# Patient Record
Sex: Female | Born: 1985
Health system: Southern US, Community
[De-identification: ages and names within clinical notes are randomized; demographics above are authoritative.]

## PROBLEM LIST (undated history)

## (undated) DIAGNOSIS — I10 Essential (primary) hypertension: Secondary | ICD-10-CM

## (undated) HISTORY — PX: TUBAL LIGATION: SHX77

## (undated) HISTORY — PX: CHOLECYSTECTOMY: SHX55

## (undated) HISTORY — DX: Essential (primary) hypertension: I10

---

## 2008-08-01 ENCOUNTER — Observation Stay: Payer: Self-pay

## 2008-08-09 ENCOUNTER — Inpatient Hospital Stay: Payer: Self-pay

## 2008-08-09 ENCOUNTER — Observation Stay: Payer: Self-pay

## 2009-02-25 ENCOUNTER — Ambulatory Visit: Payer: Self-pay | Admitting: Family Medicine

## 2009-03-30 ENCOUNTER — Encounter: Payer: Self-pay | Admitting: Obstetrics and Gynecology

## 2009-05-14 ENCOUNTER — Encounter: Payer: Self-pay | Admitting: Maternal and Fetal Medicine

## 2009-07-20 ENCOUNTER — Encounter: Payer: Self-pay | Admitting: Maternal & Fetal Medicine

## 2009-08-06 ENCOUNTER — Encounter: Payer: Self-pay | Admitting: Obstetrics & Gynecology

## 2009-08-06 ENCOUNTER — Observation Stay: Payer: Self-pay | Admitting: Obstetrics and Gynecology

## 2009-08-07 ENCOUNTER — Inpatient Hospital Stay: Payer: Self-pay | Admitting: Obstetrics and Gynecology

## 2010-08-15 IMAGING — US ULTRAOUND OB LIMITED - NRPT [facility]
1 series · 5 of 5 positions shown · non-contrast
Comparison: none

[Series 1: ultraound ob limited - [facility]pt [facility] · 5 of 5 slices shown]
[im 1/5]
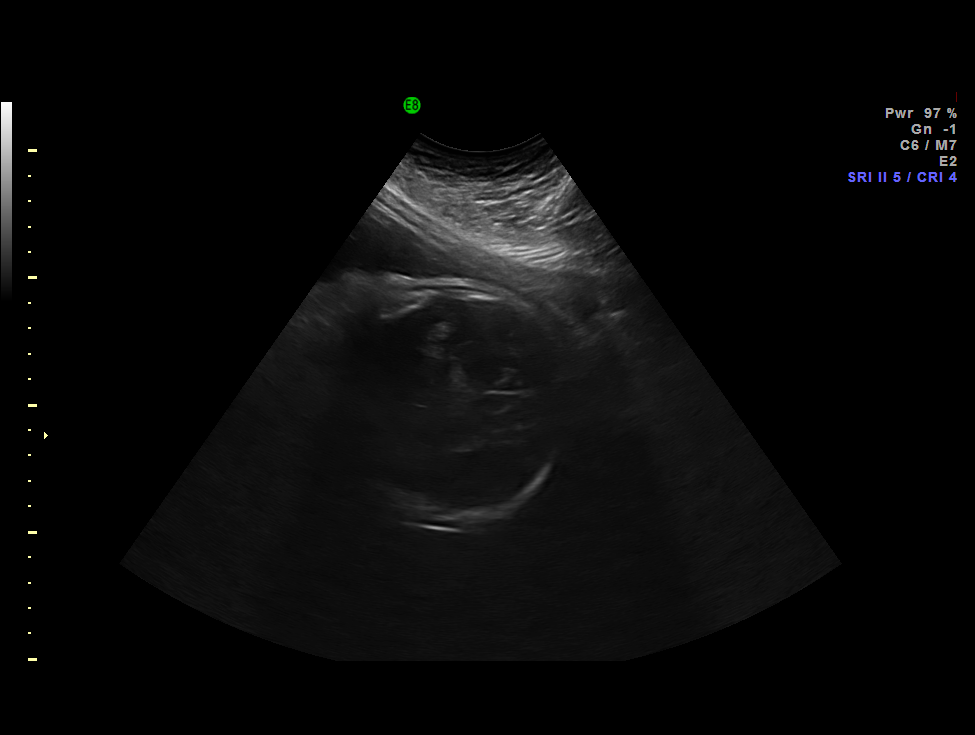
[im 2/5]
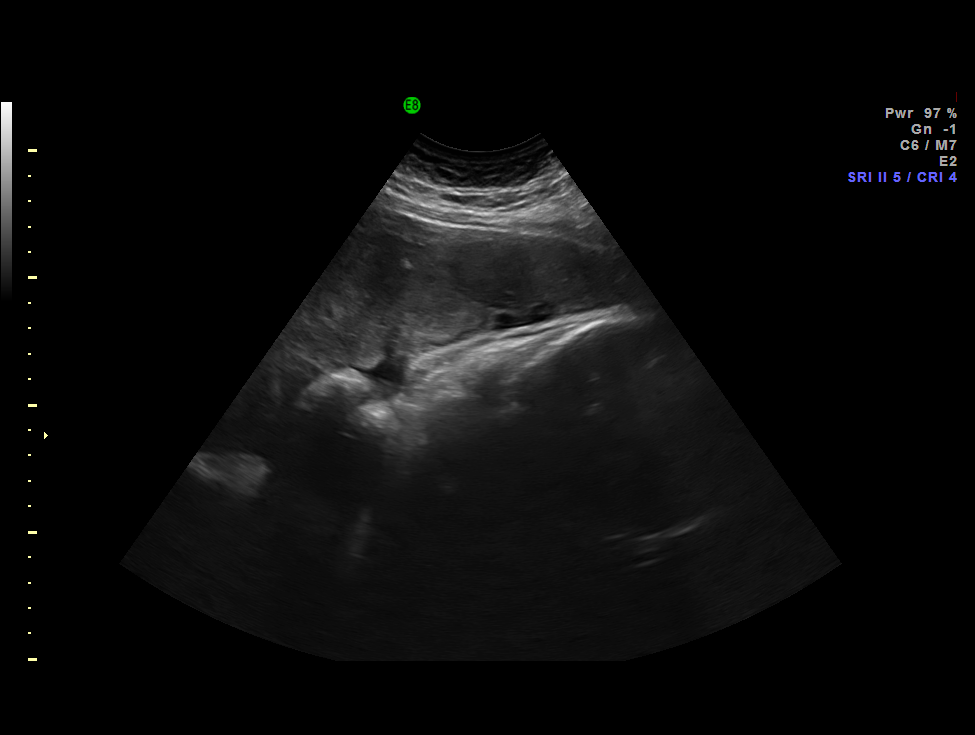
[im 3/5]
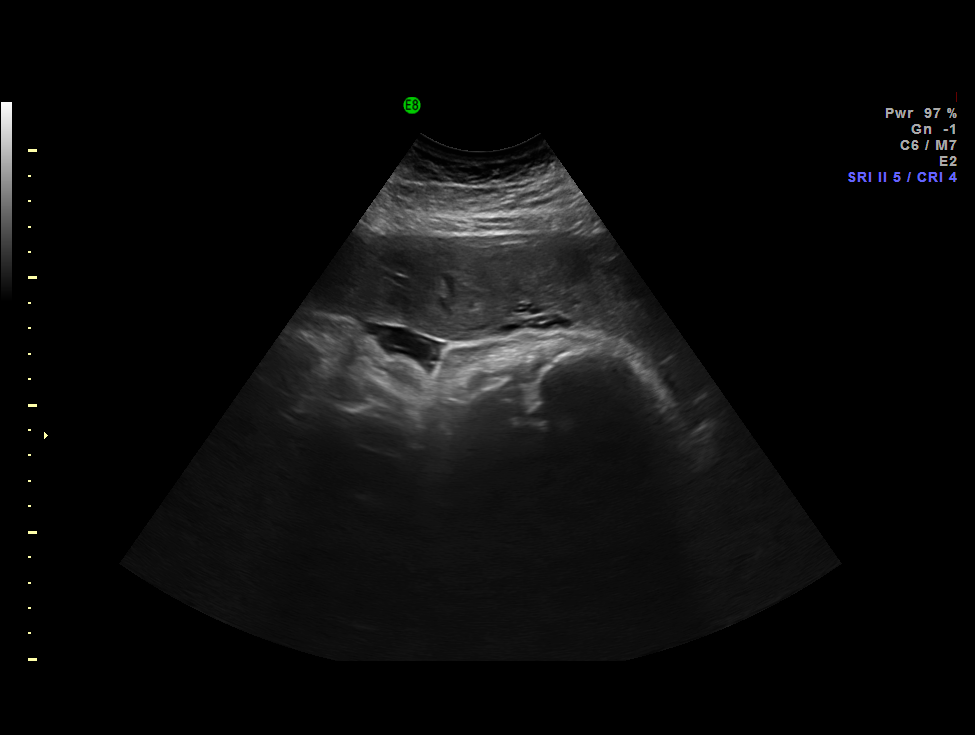
[im 4/5]
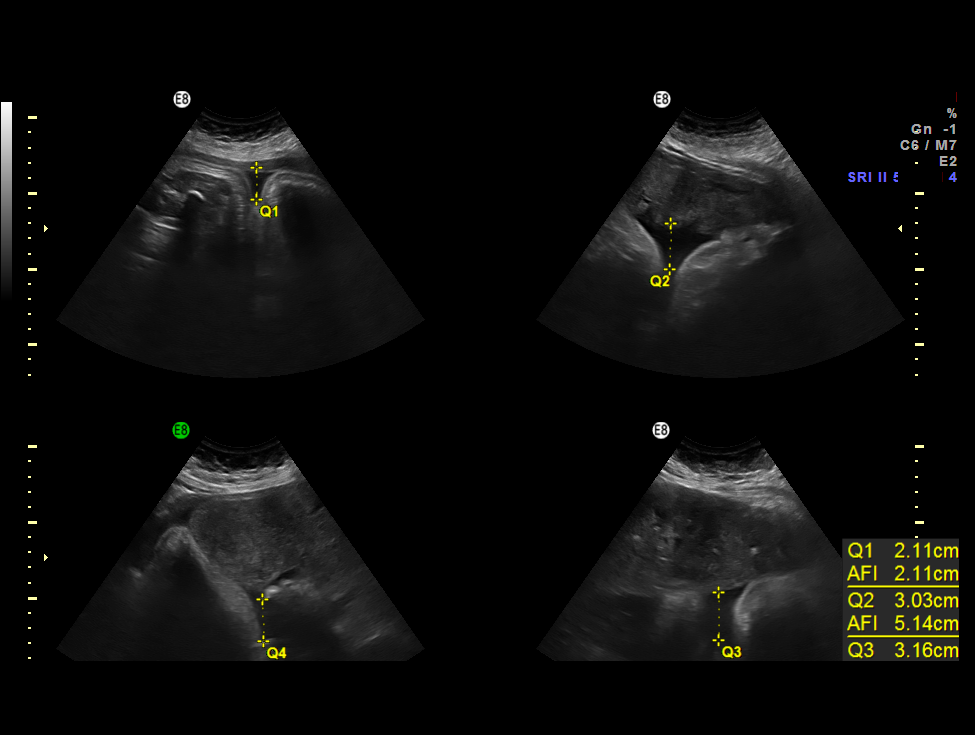
[im 5/5]
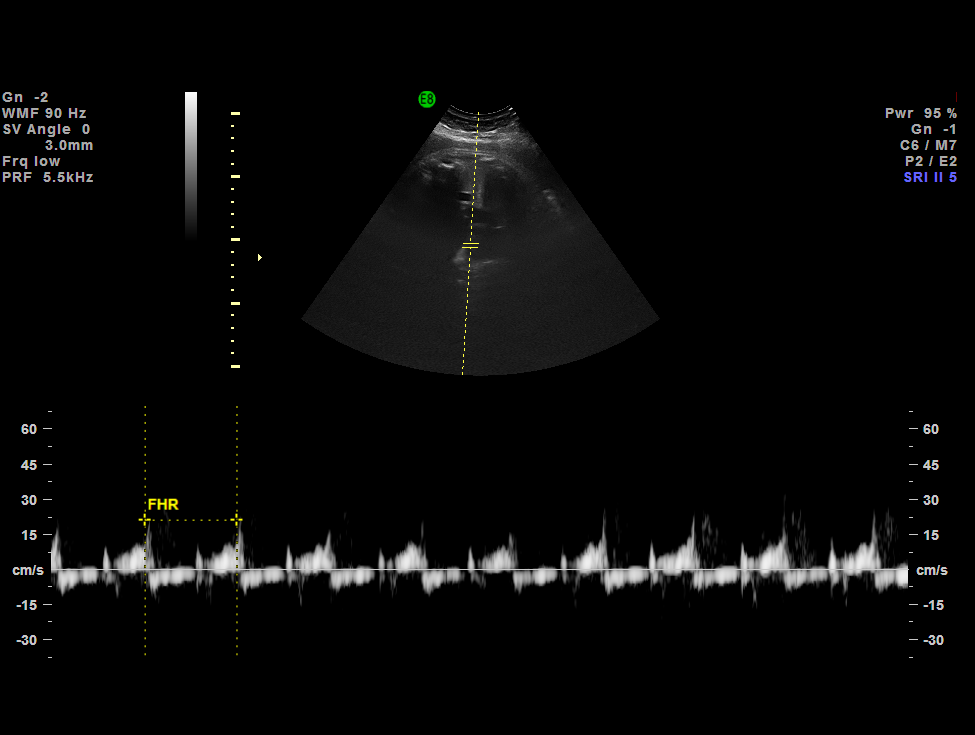

[5 of 5 positions shown; findings below may reference images not displayed]

IMAGES IMPORTED FROM THE SYNGO WORKFLOW SYSTEM
NO DICTATION FOR STUDY

## 2011-05-04 ENCOUNTER — Inpatient Hospital Stay: Payer: Self-pay | Admitting: Obstetrics & Gynecology

## 2011-05-09 LAB — PATHOLOGY REPORT

## 2012-11-14 ENCOUNTER — Ambulatory Visit: Payer: Self-pay | Admitting: Family Medicine

## 2012-12-12 ENCOUNTER — Ambulatory Visit: Payer: Self-pay | Admitting: Anesthesiology

## 2012-12-12 LAB — PREGNANCY, URINE: Pregnancy Test, Urine: NEGATIVE m[IU]/mL

## 2012-12-12 LAB — HEPATIC FUNCTION PANEL A (ARMC)
Albumin: 3.6 g/dL (ref 3.4–5.0)
Alkaline Phosphatase: 80 U/L (ref 50–136)
Bilirubin,Total: 0.8 mg/dL (ref 0.2–1.0)
SGPT (ALT): 24 U/L (ref 12–78)

## 2012-12-13 ENCOUNTER — Ambulatory Visit: Payer: Self-pay | Admitting: Emergency Medicine

## 2012-12-14 LAB — PATHOLOGY REPORT

## 2012-12-24 ENCOUNTER — Encounter: Payer: Self-pay | Admitting: General Surgery

## 2012-12-24 ENCOUNTER — Ambulatory Visit (INDEPENDENT_AMBULATORY_CARE_PROVIDER_SITE_OTHER): Payer: 59 | Admitting: General Surgery

## 2012-12-24 VITALS — BP 150/80 | HR 72 | Resp 16 | Ht 67.0 in | Wt 220.0 lb

## 2012-12-24 DIAGNOSIS — K8010 Calculus of gallbladder with chronic cholecystitis without obstruction: Secondary | ICD-10-CM | POA: Insufficient documentation

## 2012-12-24 NOTE — Progress Notes (Signed)
Patient ID: Tiffany Campbell, female   DOB: 10-07-1985, 27 y.o.   MRN: [id number]  Chief Complaint  Patient presents with  . Other    suture removal    HPI Tiffany Campbell is a 27 y.o. female here today for staple removal from gallbladder surgery done on 12/13/12. Patient states she feels fine. Surgery was done by Dr. Cecelia Byars last week.   HPI  Past Medical History  Diagnosis Date  . Hypertension     Past Surgical History  Procedure Laterality Date  . Cholecystectomy    . Tubal ligation      No family history on file.  Social History History  Substance Use Topics  . Smoking status: Never Smoker   . Smokeless tobacco: Never Used  . Alcohol Use: No    No Known Allergies  Current Outpatient Prescriptions  Medication Sig Dispense Refill  . DiphenhydrAMINE HCl (BENADRYL ALLERGY PO) Take by mouth.       No current facility-administered medications for this visit.    Review of Systems Review of Systems  Constitutional: Negative.   Cardiovascular: Negative.     Height 5\' 7"  (1.702 m), weight 220 lb (99.791 kg).  Physical Exam Physical Exam  Constitutional: She appears well-developed and well-nourished.  Cardiovascular: Normal rate, regular rhythm and normal heart sounds.   Pulmonary/Chest: Breath sounds normal.  Abdominal: Soft. There is no tenderness.  Oortsites clean. Staples removed   Data Reviewed None   Assessment    Port sites look clean , lungs clear, staples removal.    Plan    No restrictions        Ples Specter 12/24/2012, 9:54 AM

## 2012-12-24 NOTE — Patient Instructions (Addendum)
Patient to return as needed. 

## 2014-05-19 ENCOUNTER — Encounter: Payer: Self-pay | Admitting: General Surgery

## 2014-11-07 NOTE — Op Note (Signed)
PATIENT NAME:  Tiffany Campbell, Tiffany Campbell MR#:  [id number] DATE OF BIRTH:  Jul 15, 1986  DATE OF PROCEDURE:  12/13/2012  PREOPERATIVE DIAGNOSIS: Acute cholecystitis.   POSTOPERATIVE DIAGNOSIS: Acute cholecystitis.   PROCEDURE PERFORMED: Laparoscopic cholecystectomy.   INDICATIONS:  This patient was seen by me because of right upper quadrant abdominal pain. The patient's ultrasound done showed multiple stones. The patient was then brought to surgery.   DESCRIPTION OF PROCEDURE:  Under general anesthesia, the abdomen was then prepped and draped. A small incision was made over the umbilicus. After cutting skin and subcutaneous tissue, the fascia was cut. The peritoneum was entered under direct vision. After entering the abdomen, we put a trocar in, and another 3 trocars were put in under direct vision. The patient's gallbladder was thickened and small, and it was very short, and the patient's liver was very big, which was hard to do the surgery, but the gallbladder was lifted up. There was stone stuck in the [facility] pouch. Dissection was done over the cystic duct gallbladder junction. Cystic artery was then clipped and cut. Cystic duct was then exposed. It was then clipped and cut. The gallbladder was then removed from the liver bed without any problem, and then removed from the umbilical port back to a bag. The patient had thousands of stones, and it took a long time to take it off from the abdomen. After that, irrigation of the abdomen was performed, which showed there was no bleeding from the liver and no biliary drainage, and after that was made sure of it, put a piece of Surgicel in the liver bed, and all the trocars were removed under direct vision. The umbilical port was closed with interrupted 0 Vicryl sutures. Marcaine was injected and staples applied. The patient tolerated the procedure well and was sent to the recovery room in satisfactory condition.    ____________________________ Alton RevereMasud S. Cecelia ByarsHashmi,  MD msh:dmm D: 12/13/2012 11:28:46 ET T: 12/13/2012 11:44:12 ET JOB#: [id number]  cc: Carrolyn Hilmes S. Cecelia ByarsHashmi, MD, <Dictator> Dierdre Searles. Paul Harris, MD Meryle ReadyMASUD S Chasidy Janak MD ELECTRONICALLY SIGNED 01/16/2013 16:14

## 2015-05-05 ENCOUNTER — Other Ambulatory Visit: Payer: Self-pay | Admitting: Rheumatology

## 2015-05-05 DIAGNOSIS — M25561 Pain in right knee: Secondary | ICD-10-CM

## 2015-05-14 ENCOUNTER — Ambulatory Visit
Admission: RE | Admit: 2015-05-14 | Discharge: 2015-05-14 | Disposition: A | Discharge status: Home / Self Care | Payer: 59 | Source: Ambulatory Visit | Attending: Rheumatology | Admitting: Rheumatology

## 2015-05-14 DIAGNOSIS — M25561 Pain in right knee: Secondary | ICD-10-CM | POA: Diagnosis present

## 2015-05-14 DIAGNOSIS — M94261 Chondromalacia, right knee: Secondary | ICD-10-CM | POA: Diagnosis not present

## 2015-05-14 DIAGNOSIS — S83281A Other tear of lateral meniscus, current injury, right knee, initial encounter: Secondary | ICD-10-CM | POA: Diagnosis not present

## 2015-05-14 DIAGNOSIS — M948X6 Other specified disorders of cartilage, lower leg: Secondary | ICD-10-CM | POA: Diagnosis not present

## 2016-03-18 ENCOUNTER — Emergency Department
Admission: EM | Admit: 2016-03-18 | Discharge: 2016-03-19 | Disposition: A | Discharge status: Home / Self Care | Payer: 59 | Attending: Emergency Medicine | Admitting: Emergency Medicine

## 2016-03-18 ENCOUNTER — Encounter: Payer: Self-pay | Admitting: Emergency Medicine

## 2016-03-18 DIAGNOSIS — R300 Dysuria: Secondary | ICD-10-CM

## 2016-03-18 DIAGNOSIS — I10 Essential (primary) hypertension: Secondary | ICD-10-CM | POA: Insufficient documentation

## 2016-03-18 DIAGNOSIS — R55 Syncope and collapse: Secondary | ICD-10-CM | POA: Insufficient documentation

## 2016-03-18 DIAGNOSIS — E876 Hypokalemia: Secondary | ICD-10-CM | POA: Diagnosis not present

## 2016-03-18 DIAGNOSIS — N390 Urinary tract infection, site not specified: Secondary | ICD-10-CM | POA: Diagnosis not present

## 2016-03-18 LAB — CBC
HEMATOCRIT: 36.8 % (ref 35.0–47.0)
Hemoglobin: 13.1 g/dL (ref 12.0–16.0)
MCH: 32.6 pg (ref 26.0–34.0)
MCHC: 35.5 g/dL (ref 32.0–36.0)
MCV: 91.9 fL (ref 80.0–100.0)
PLATELETS: 290 10*3/uL (ref 150–440)
RBC: 4 MIL/uL (ref 3.80–5.20)
RDW: 13 % (ref 11.5–14.5)
WBC: 17 10*3/uL — AB (ref 3.6–11.0)

## 2016-03-18 LAB — BASIC METABOLIC PANEL
Anion gap: 10 (ref 5–15)
BUN: 20 mg/dL (ref 6–20)
CO2: 26 mmol/L (ref 22–32)
CREATININE: 1.15 mg/dL — AB (ref 0.44–1.00)
Calcium: 9.6 mg/dL (ref 8.9–10.3)
Chloride: 99 mmol/L — ABNORMAL LOW (ref 101–111)
Glucose, Bld: 213 mg/dL — ABNORMAL HIGH (ref 65–99)
POTASSIUM: 2.5 mmol/L — AB (ref 3.5–5.1)
SODIUM: 135 mmol/L (ref 135–145)

## 2016-03-18 LAB — URINALYSIS COMPLETE WITH MICROSCOPIC (ARMC ONLY)
Bilirubin Urine: NEGATIVE
GLUCOSE, UA: NEGATIVE mg/dL
Hgb urine dipstick: NEGATIVE
KETONES UR: NEGATIVE mg/dL
NITRITE: NEGATIVE
Protein, ur: 100 mg/dL — AB
SPECIFIC GRAVITY, URINE: 1.021 (ref 1.005–1.030)
pH: 5 (ref 5.0–8.0)

## 2016-03-18 LAB — POCT PREGNANCY, URINE: Preg Test, Ur: NEGATIVE

## 2016-03-18 MED ORDER — SODIUM CHLORIDE 0.9 % IV BOLUS (SEPSIS)
1000.0000 mL | Freq: Once | INTRAVENOUS | Status: AC
  Administered 2016-03-18: 1000 mL via INTRAVENOUS

## 2016-03-18 NOTE — ED Triage Notes (Signed)
Patient reports "I fell out while I was using the bathroom."  Patient also reports "it don't feel right when I try to pee."  Patient described dysuria and reports she was bearing down hard when trying to urinate when she passed out.

## 2016-03-18 NOTE — ED Notes (Signed)
Dr Dolores FrameSung notified of Potassium of 2.5

## 2016-03-18 NOTE — ED Provider Notes (Signed)
[facility] Emergency Department Provider Note   ____________________________________________   First MD Initiated Contact with Patient 03/18/16 2350     (approximate)  I have reviewed the triage vital signs and the nursing notes.   HISTORY  Chief Complaint Loss of Consciousness and Dysuria    HPI Tiffany Campbell is a 30 y.o. female who presents to the ED from home with a chief complaint of syncope and dysuria. Patient reports pain and burning on urination times one day. Reports she was bearing down hard while trying to urinate, stood up and passed out. Denies injuryto head or neck. Complains of left elbow and right upper arm pain. Denies recent fever, chills, chest pain, shortness of breath, nausea, vomiting, diarrhea. Denies recent travel or trauma. Denies hormone use. Nothing makes her symptoms better or worse.   Past Medical History:  Diagnosis Date  . Hypertension     Patient Active Problem List   Diagnosis Date Noted  . Calculus of gallbladder with other cholecystitis, without mention of obstruction 12/24/2012    Past Surgical History:  Procedure Laterality Date  . CHOLECYSTECTOMY    . TUBAL LIGATION      Prior to Admission medications   Medication Sig Start Date End Date Taking? Authorizing Provider  benazepril (LOTENSIN) 40 MG tablet Take 40 mg by mouth daily.    Historical Provider, MD    Allergies Review of patient's allergies indicates no known allergies.  No family history on file.  Social History Social History  Substance Use Topics  . Smoking status: Never Smoker  . Smokeless tobacco: Never Used  . Alcohol use No    Review of Systems  Constitutional: No fever/chills. Eyes: No visual changes. ENT: No sore throat. Cardiovascular: Denies chest pain. Respiratory: Denies shortness of breath. Gastrointestinal: No abdominal pain.  No nausea, no vomiting.  No diarrhea.  No constipation. Genitourinary: Positive for  dysuria. Musculoskeletal: Positive for left elbow and right upper arm pain. Negative for back pain. Skin: Negative for rash. Neurological: Negative for headaches, focal weakness or numbness.  10-point ROS otherwise negative.  ____________________________________________   PHYSICAL EXAM:  VITAL SIGNS: ED Triage Vitals  Enc Vitals Group     BP 03/18/16 2201 112/65     Pulse Rate 03/18/16 2201 88     Resp 03/18/16 2201 20     Temp 03/18/16 2201 98.4 F (36.9 C)     Temp Source 03/18/16 2201 Oral     SpO2 03/18/16 2201 100 %     Weight 03/18/16 2158 194 lb (88 kg)     Height 03/18/16 2158 5\' 6"  (1.676 m)     Head Circumference --      Peak Flow --      Pain Score 03/18/16 2159 7     Pain Loc --      Pain Edu? --      Excl. in GC? --     Constitutional: Alert and oriented. Well appearing and in no acute distress. Eyes: Conjunctivae are normal. PERRL. EOMI. Head: Atraumatic. Nose: No congestion/rhinnorhea. Mouth/Throat: Mucous membranes are moist.  Oropharynx non-erythematous. Neck: No stridor.  No cervical spine tenderness to palpation. Cardiovascular: Normal rate, regular rhythm. Grossly normal heart sounds.  Good peripheral circulation. Respiratory: Normal respiratory effort.  No retractions. Lungs CTAB. Gastrointestinal: Soft and nontender. No distention. No abdominal bruits. No CVA tenderness. Musculoskeletal: Left elbow without deformity. Tender to palpation and on range of motion. Right shoulder full range of motion without pain. Right  upper humerus mildly tender to palpation without ecchymosis or deformity. No lower extremity tenderness nor edema.  No joint effusions. Neurologic:  Normal speech and language. No gross focal neurologic deficits are appreciated. No gait instability. Skin:  Skin is warm, dry and intact. No rash noted. Psychiatric: Mood and affect are normal. Speech and behavior are normal.  ____________________________________________   LABS (all labs  ordered are listed, but only abnormal results are displayed)  Labs Reviewed  BASIC METABOLIC PANEL - Abnormal; Notable for the following:       Result Value   Potassium 2.5 (*)    Chloride 99 (*)    Glucose, Bld 213 (*)    Creatinine, Ser 1.15 (*)    All other components within normal limits  CBC - Abnormal; Notable for the following:    WBC 17.0 (*)    All other components within normal limits  URINALYSIS COMPLETEWITH MICROSCOPIC ([facility] ONLY) - Abnormal; Notable for the following:    Color, Urine AMBER (*)    APPearance HAZY (*)    Protein, ur 100 (*)    Leukocytes, UA 2+ (*)    Bacteria, UA RARE (*)    Squamous Epithelial / LPF 6-30 (*)    All other components within normal limits  POC URINE PREG, ED  POCT PREGNANCY, URINE   ____________________________________________  EKG  ED ECG REPORT I, Shaunita Seney J, the attending physician, personally viewed and interpreted this ECG.   Date: 03/19/2016  EKG Time: 2214  Rate: 78  Rhythm: normal EKG, normal sinus rhythm  Axis: Normal  Intervals:first-degree A-V block   ST&T Change: Nonspecific  ____________________________________________  RADIOLOGY  Left elbow x-rays (viewed by me, interpreted per Dr. Cherly Hensenhang): No evidence of fracture or dislocation. ____________________________________________   PROCEDURES  Procedure(s) performed: None  Procedures  Critical Care performed: No  ____________________________________________   INITIAL IMPRESSION / ASSESSMENT AND PLAN / ED COURSE  Pertinent labs & imaging results that were available during my care of the patient were reviewed by me and considered in my medical decision making (see chart for details).  30 year old female who presents status post syncopal episode while bearing down to urinate. Also complains of dysuria. Laboratory urinalysis results notable for hypokalemia, leukocytosis which is secondary to UTI. Will administer IV fluids, antibiotics and replete  potassium orally.  Clinical Course  Comment By Time  Patient feeling much better. Orthostatics were negative. Will discharge home with a prescription for Keflex, Pyridium and limited per prescription for potassium supplementation. Instructed patient to follow up closely with her PCP early next week. Strict return precautions given. All verbalize understanding and agree with plan of care. Irean HongJade J Mate Alegria, MD 09/02 0153     ____________________________________________   FINAL CLINICAL IMPRESSION(S) / ED DIAGNOSES  Final diagnoses:  UTI (lower urinary tract infection)  Dysuria  Hypokalemia  Syncope, unspecified syncope type      NEW MEDICATIONS STARTED DURING THIS VISIT:  New Prescriptions   No medications on file     Note:  This document was prepared using Dragon voice recognition software and may include unintentional dictation errors.    Irean HongJade J Reizy Dunlow, MD 03/19/16 [phone]

## 2016-03-19 ENCOUNTER — Emergency Department: Payer: 59

## 2016-03-19 MED ORDER — PHENAZOPYRIDINE HCL 200 MG PO TABS
ORAL_TABLET | ORAL | Status: AC
  Administered 2016-03-19: 200 mg via ORAL
  Filled 2016-03-19: qty 1

## 2016-03-19 MED ORDER — POTASSIUM CHLORIDE CRYS ER 20 MEQ PO TBCR
60.0000 meq | EXTENDED_RELEASE_TABLET | Freq: Once | ORAL | Status: AC
Start: 2016-03-19 — End: 2016-03-19
  Administered 2016-03-19: 60 meq via ORAL
  Filled 2016-03-19: qty 3

## 2016-03-19 MED ORDER — PHENAZOPYRIDINE HCL 200 MG PO TABS
200.0000 mg | ORAL_TABLET | Freq: Once | ORAL | Status: AC
  Administered 2016-03-19: 200 mg via ORAL

## 2016-03-19 MED ORDER — CEPHALEXIN 500 MG PO CAPS: 500.0000 mg | ORAL_CAPSULE | Freq: Three times a day (TID) | ORAL | 0 refills | Status: DC

## 2016-03-19 MED ORDER — DEXTROSE 5 % IV SOLN
1.0000 g | INTRAVENOUS | Status: DC
  Administered 2016-03-19: 1 g via INTRAVENOUS
  Filled 2016-03-19: qty 10

## 2016-03-19 MED ORDER — PHENAZOPYRIDINE HCL 200 MG PO TABS: 200.0000 mg | ORAL_TABLET | Freq: Three times a day (TID) | ORAL | 0 refills | Status: DC | PRN

## 2016-03-19 MED ORDER — POTASSIUM CHLORIDE ER 10 MEQ PO TBCR: 10.0000 meq | EXTENDED_RELEASE_TABLET | Freq: Every day | ORAL | 0 refills | Status: DC

## 2016-03-19 NOTE — ED Notes (Signed)

## 2016-03-19 NOTE — Discharge Instructions (Signed)
1. Take antibiotic as prescribed (Keflex 500 mg 3 times daily 7 days). 2. You may take Pyridium as prescribed for urinary discomfort. 3. Take 10 mEq potassium supplements daily for the next 5 days. 4. Return to the ER for worsening symptoms, persistent vomiting, difficulty breathing or other concerns.

## 2016-08-09 DIAGNOSIS — R809 Proteinuria, unspecified: Secondary | ICD-10-CM | POA: Diagnosis not present

## 2016-08-09 DIAGNOSIS — I10 Essential (primary) hypertension: Secondary | ICD-10-CM | POA: Diagnosis not present

## 2016-08-09 DIAGNOSIS — N390 Urinary tract infection, site not specified: Secondary | ICD-10-CM | POA: Diagnosis not present

## 2017-08-29 DIAGNOSIS — J019 Acute sinusitis, unspecified: Secondary | ICD-10-CM | POA: Diagnosis not present

## 2017-08-29 DIAGNOSIS — R070 Pain in throat: Secondary | ICD-10-CM | POA: Diagnosis not present

## 2017-09-10 DIAGNOSIS — L509 Urticaria, unspecified: Secondary | ICD-10-CM | POA: Diagnosis not present

## 2017-10-02 ENCOUNTER — Encounter: Payer: Self-pay | Admitting: Nurse Practitioner

## 2017-10-07 ENCOUNTER — Other Ambulatory Visit: Payer: Self-pay | Admitting: Internal Medicine

## 2017-10-12 ENCOUNTER — Ambulatory Visit (INDEPENDENT_AMBULATORY_CARE_PROVIDER_SITE_OTHER): Payer: Managed Care, Other (non HMO) | Admitting: Internal Medicine

## 2017-10-12 ENCOUNTER — Encounter: Payer: Self-pay | Admitting: Internal Medicine

## 2017-10-12 VITALS — BP 140/80 | HR 90 | Resp 16 | Ht 67.0 in | Wt 167.0 lb

## 2017-10-12 DIAGNOSIS — Z0001 Encounter for general adult medical examination with abnormal findings: Secondary | ICD-10-CM

## 2017-10-12 DIAGNOSIS — R300 Dysuria: Secondary | ICD-10-CM

## 2017-10-12 DIAGNOSIS — I10 Essential (primary) hypertension: Secondary | ICD-10-CM | POA: Diagnosis not present

## 2017-10-12 DIAGNOSIS — K219 Gastro-esophageal reflux disease without esophagitis: Secondary | ICD-10-CM

## 2017-10-12 DIAGNOSIS — R112 Nausea with vomiting, unspecified: Secondary | ICD-10-CM

## 2017-10-12 MED ORDER — ESOMEPRAZOLE MAGNESIUM 40 MG PO CPDR: 40.0000 mg | DELAYED_RELEASE_CAPSULE | Freq: Every day | ORAL | 3 refills | Status: DC

## 2017-10-12 NOTE — Progress Notes (Signed)
[facility] [address] [place], [place] [address]  Internal MEDICINE  Office Visit Note  Patient Name: Tiffany Campbell  [id number]  [id number]  Date of Service: 10/25/2017  Chief Complaint  Patient presents with  . Hypertension  . Vomiting  . Annual Exam     HPI Pt is here for routine health maintenance examination. Co nausea and vomiting after getting choked on her food. She does have h/o cholecystectomy, continues to gain weight  Current Medication: Outpatient Encounter Medications as of 10/12/2017  Medication Sig  . triamterene-hydrochlorothiazide (DYAZIDE) 37.5-25 MG capsule TAKE 1 CAPSULE BY MOUTH  EVERY MORNING  . benazepril (LOTENSIN) 40 MG tablet Take 40 mg by mouth daily.  . cephALEXin (KEFLEX) 500 MG capsule Take 1 capsule (500 mg total) by mouth 3 (three) times daily. (Patient not taking: Reported on 10/12/2017)  . phenazopyridine (PYRIDIUM) 200 MG tablet Take 1 tablet (200 mg total) by mouth 3 (three) times daily as needed for pain. (Patient not taking: Reported on 10/12/2017)  . potassium chloride (K-DUR) 10 MEQ tablet Take 1 tablet (10 mEq total) by mouth daily. (Patient not taking: Reported on 10/12/2017)  . [DISCONTINUED] esomeprazole (NEXIUM) 40 MG capsule Take 1 capsule (40 mg total) by mouth daily.   No facility-administered encounter medications on file as of 10/12/2017.     Surgical History: Past Surgical History:  Procedure Laterality Date  . CHOLECYSTECTOMY    . TUBAL LIGATION      Medical History: Past Medical History:  Diagnosis Date  . Hypertension     Family History: Family History  Problem Relation Age of Onset  . Hypertension Mother   . Diabetes Father   . Diabetes Maternal Grandmother   . Diabetes Paternal Grandmother    Review of Systems  Constitutional: Negative for chills, fatigue and unexpected weight change.  HENT: Negative for congestion, postnasal drip, rhinorrhea, sneezing and sore throat.   Eyes: Negative for  redness.  Respiratory: Negative for cough, chest tightness and shortness of breath.   Cardiovascular: Negative for chest pain and palpitations.  Gastrointestinal: Positive for abdominal pain and nausea. Negative for constipation, diarrhea and vomiting.  Genitourinary: Negative for dysuria and frequency.  Musculoskeletal: Negative for arthralgias, back pain, joint swelling and neck pain.  Skin: Negative for rash.  Neurological: Negative.  Negative for tremors and numbness.  Hematological: Negative for adenopathy. Does not bruise/bleed easily.  Psychiatric/Behavioral: Negative for behavioral problems (Depression), sleep disturbance and suicidal ideas. The patient is not nervous/anxious.    Vital Signs: BP 140/80   Pulse 90   Resp 16   Ht 5\' 7"  (1.702 m)   Wt 167 lb (75.8 kg)   LMP 10/12/2017   SpO2 97%   BMI 26.16 kg/m   Physical Exam  Constitutional: She appears well-developed and well-nourished. No distress.  HENT:  Head: Normocephalic and atraumatic.  Right Ear: External ear normal.  Left Ear: External ear normal.  Nose: Nose normal.  Mouth/Throat: Oropharynx is clear and moist. No oropharyngeal exudate.  Eyes: Pupils are equal, round, and reactive to light. Conjunctivae and EOM are normal. Right eye exhibits no discharge. Left eye exhibits no discharge. No scleral icterus.  Neck: Normal range of motion. Neck supple. No JVD present. No tracheal deviation present. No thyromegaly present.  Cardiovascular: Normal rate, regular rhythm, normal heart sounds and intact distal pulses. Exam reveals no gallop and no friction rub.  No murmur heard. Pulmonary/Chest: Effort normal and breath sounds normal. No stridor. No respiratory distress. She has  no wheezes. She has no rales. She exhibits no tenderness.  Abdominal: Soft. Bowel sounds are normal. She exhibits no distension and no mass. There is no tenderness. There is no rebound and no guarding.  Genitourinary: Vagina normal and uterus  normal. No vaginal discharge found.  Musculoskeletal: Normal range of motion. She exhibits no edema, tenderness or deformity.  Lymphadenopathy:    She has no cervical adenopathy.  Neurological: She is alert. She has normal reflexes. No cranial nerve deficit. She exhibits normal muscle tone. Coordination normal.  Skin: Skin is warm and dry. No rash noted. She is not diaphoretic. No erythema. No pallor.  Psychiatric: She has a normal mood and affect. Her behavior is normal. Judgment and thought content normal.     LABS: Recent Results (from the past 2160 hour(s))  Urinalysis, Routine w reflex microscopic     Status: None   Collection Time: 10/12/17  3:54 PM  Result Value Ref Range   Specific Gravity, UA 1.014 1.005 - 1.030   pH, UA 7.5 5.0 - 7.5   Color, UA Yellow Yellow   Appearance Ur Clear Clear   Leukocytes, UA Negative Negative   Protein, UA Negative Negative/Trace   Glucose, UA Negative Negative   Ketones, UA Negative Negative   RBC, UA Negative Negative   Bilirubin, UA Negative Negative   Urobilinogen, Ur 1.0 0.2 - 1.0 mg/dL   Nitrite, UA Negative Negative   Microscopic Examination Comment     Comment: Microscopic not indicated and not performed.  CBC with Differential/Platelet     Status: None   Collection Time: 10/13/17  8:00 AM  Result Value Ref Range   WBC 8.5 3.4 - 10.8 x10E3/uL   RBC 3.98 3.77 - 5.28 x10E6/uL   Hemoglobin 12.7 11.1 - 15.9 g/dL   Hematocrit 09.8 11.9 - 46.6 %   MCV 94 79 - 97 fL   MCH 31.9 26.6 - 33.0 pg   MCHC 33.9 31.5 - 35.7 g/dL   RDW 14.7 82.9 - 56.2 %   Platelets 299 150 - 379 x10E3/uL   Neutrophils 56 Not Estab. %   Lymphs 35 Not Estab. %   Monocytes 7 Not Estab. %   Eos 1 Not Estab. %   Basos 1 Not Estab. %   Neutrophils Absolute 4.8 1.4 - 7.0 x10E3/uL   Lymphocytes Absolute 3.0 0.7 - 3.1 x10E3/uL   Monocytes Absolute 0.6 0.1 - 0.9 x10E3/uL   EOS (ABSOLUTE) 0.1 0.0 - 0.4 x10E3/uL   Basophils Absolute 0.0 0.0 - 0.2 x10E3/uL    Immature Granulocytes 0 Not Estab. %   Immature Grans (Abs) 0.0 0.0 - 0.1 x10E3/uL  Lipid Panel With LDL/HDL Ratio     Status: Abnormal   Collection Time: 10/13/17  8:00 AM  Result Value Ref Range   Cholesterol, Total 202 (H) 100 - 199 mg/dL   Triglycerides 73 0 - 149 mg/dL   HDL 60 >13 mg/dL   VLDL Cholesterol Cal 15 5 - 40 mg/dL   LDL Calculated 086 (H) 0 - 99 mg/dL   LDl/HDL Ratio 2.1 0.0 - 3.2 ratio    Comment:                                     LDL/HDL Ratio  Men  Women                               1/2 Avg.Risk  1.0    1.5                                   Avg.Risk  3.6    3.2                                2X Avg.Risk  6.2    5.0                                3X Avg.Risk  8.0    6.1   TSH     Status: None   Collection Time: 10/13/17  8:00 AM  Result Value Ref Range   TSH 1.410 0.450 - 4.500 uIU/mL  T4, free     Status: None   Collection Time: 10/13/17  8:00 AM  Result Value Ref Range   Free T4 1.32 0.82 - 1.77 ng/dL  Comprehensive metabolic panel     Status: Abnormal   Collection Time: 10/13/17  8:00 AM  Result Value Ref Range   Glucose 125 (H) 65 - 99 mg/dL   BUN 14 6 - 20 mg/dL   Creatinine, Ser 5.361.02 (H) 0.57 - 1.00 mg/dL   GFR calc non Af Amer 73 >59 mL/min/1.73   GFR calc Af Amer 84 >59 mL/min/1.73   BUN/Creatinine Ratio 14 9 - 23   Sodium 143 134 - 144 mmol/L   Potassium 3.9 3.5 - 5.2 mmol/L   Chloride 102 96 - 106 mmol/L   CO2 26 20 - 29 mmol/L   Calcium 10.0 8.7 - 10.2 mg/dL   Total Protein 7.4 6.0 - 8.5 g/dL   Albumin 4.5 3.5 - 5.5 g/dL   Globulin, Total 2.9 1.5 - 4.5 g/dL   Albumin/Globulin Ratio 1.6 1.2 - 2.2   Bilirubin Total 0.6 0.0 - 1.2 mg/dL   Alkaline Phosphatase 73 39 - 117 IU/L   AST 12 0 - 40 IU/L   ALT 14 0 - 32 IU/L  Magnesium     Status: None   Collection Time: 10/13/17  8:00 AM  Result Value Ref Range   Magnesium 1.9 1.6 - 2.3 mg/dL   Assessment/Plan: 1. Encounter for general adult medical  examination with abnormal findings - Ordered - CBC with Differential/Platelet - Lipid Panel With LDL/HDL Ratio - TSH - T4, free - Comprehensive metabolic panel  2. Gastroesophageal reflux disease without esophagitis - Will add PPI - DG UGI W/O KUB; Future - Magnesium  3. Essential hypertension, malignant - Continue Benazepril and triam/hctz. - Monitor creatinine  4. Dysuria - Urinalysis, Routine w reflex microscopic  5. Nausea and vomiting, intractability of vomiting not specified, unspecified vomiting type - Might need to see GI after ugi results are available   General Counseling: Ardine verbalizes understanding of the findings of todays visit and agrees with plan of treatment. I have discussed any further diagnostic evaluation that may be needed or ordered today. We also reviewed her medications today. she has been encouraged to call the office with any questions or concerns that should arise related to todays visit.   Orders Placed This Encounter  Procedures  . DG UGI W/O KUB  . Urinalysis, Routine w reflex microscopic  . CBC with Differential/Platelet  . Lipid Panel With LDL/HDL Ratio  . TSH  . T4, free  . Comprehensive metabolic panel  . Magnesium    Meds ordered this encounter  Medications  . DISCONTD: esomeprazole (NEXIUM) 40 MG capsule    Sig: Take 1 capsule (40 mg total) by mouth daily.    Dispense:  30 capsule    Refill:  3    Time spent: 82  Minutes   Lyndon Code, MD  Internal Medicine

## 2017-10-13 ENCOUNTER — Encounter: Payer: Self-pay | Admitting: Internal Medicine

## 2017-10-13 ENCOUNTER — Telehealth: Payer: Self-pay

## 2017-10-13 ENCOUNTER — Other Ambulatory Visit: Payer: Self-pay

## 2017-10-13 ENCOUNTER — Other Ambulatory Visit: Payer: Self-pay | Admitting: Nurse Practitioner

## 2017-10-13 DIAGNOSIS — K219 Gastro-esophageal reflux disease without esophagitis: Secondary | ICD-10-CM

## 2017-10-13 DIAGNOSIS — Z0001 Encounter for general adult medical examination with abnormal findings: Secondary | ICD-10-CM | POA: Diagnosis not present

## 2017-10-13 LAB — URINALYSIS, ROUTINE W REFLEX MICROSCOPIC
Bilirubin, UA: NEGATIVE
Glucose, UA: NEGATIVE
Ketones, UA: NEGATIVE
Leukocytes, UA: NEGATIVE
NITRITE UA: NEGATIVE
Protein, UA: NEGATIVE
RBC UA: NEGATIVE
Specific Gravity, UA: 1.014 (ref 1.005–1.030)
UUROB: 1 mg/dL (ref 0.2–1.0)
pH, UA: 7.5 (ref 5.0–7.5)

## 2017-10-13 MED ORDER — PANTOPRAZOLE SODIUM 40 MG PO TBEC: 40.0000 mg | DELAYED_RELEASE_TABLET | Freq: Every day | ORAL | 3 refills | Status: DC

## 2017-10-13 MED ORDER — PANTOPRAZOLE SODIUM 40 MG PO TBEC: 40.0000 mg | DELAYED_RELEASE_TABLET | Freq: Every day | ORAL | 1 refills | Status: DC

## 2017-10-13 NOTE — Telephone Encounter (Signed)
D/c nexium as insurance won't cover. Added pantoprazole 40mg daily and sent new rx to [facility].  

## 2017-10-13 NOTE — Progress Notes (Signed)
D/c nexium as insurance won't cover. Added pantoprazole 40mg  daily and sent new rx to [facility].

## 2017-10-13 NOTE — Telephone Encounter (Signed)
Left message advising pt of ugi at [facility] 10/17/17  10:15 [phone]

## 2017-10-14 LAB — COMPREHENSIVE METABOLIC PANEL
ALT: 14 IU/L (ref 0–32)
AST: 12 IU/L (ref 0–40)
Albumin/Globulin Ratio: 1.6 (ref 1.2–2.2)
Albumin: 4.5 g/dL (ref 3.5–5.5)
Alkaline Phosphatase: 73 IU/L (ref 39–117)
BILIRUBIN TOTAL: 0.6 mg/dL (ref 0.0–1.2)
BUN / CREAT RATIO: 14 (ref 9–23)
BUN: 14 mg/dL (ref 6–20)
CALCIUM: 10 mg/dL (ref 8.7–10.2)
CHLORIDE: 102 mmol/L (ref 96–106)
CO2: 26 mmol/L (ref 20–29)
CREATININE: 1.02 mg/dL — AB (ref 0.57–1.00)
GFR, EST AFRICAN AMERICAN: 84 mL/min/{1.73_m2} (ref >59)
GFR, EST NON AFRICAN AMERICAN: 73 mL/min/{1.73_m2} (ref >59)
GLUCOSE: 125 mg/dL — AB (ref 65–99)
Globulin, Total: 2.9 g/dL (ref 1.5–4.5)
Potassium: 3.9 mmol/L (ref 3.5–5.2)
Sodium: 143 mmol/L (ref 134–144)
TOTAL PROTEIN: 7.4 g/dL (ref 6.0–8.5)

## 2017-10-14 LAB — CBC WITH DIFFERENTIAL/PLATELET
BASOS ABS: 0 10*3/uL (ref 0.0–0.2)
Basos: 1 %
EOS (ABSOLUTE): 0.1 10*3/uL (ref 0.0–0.4)
EOS: 1 %
HEMATOCRIT: 37.5 % (ref 34.0–46.6)
HEMOGLOBIN: 12.7 g/dL (ref 11.1–15.9)
IMMATURE GRANULOCYTES: 0 %
Immature Grans (Abs): 0 10*3/uL (ref 0.0–0.1)
Lymphocytes Absolute: 3 10*3/uL (ref 0.7–3.1)
Lymphs: 35 %
MCH: 31.9 pg (ref 26.6–33.0)
MCHC: 33.9 g/dL (ref 31.5–35.7)
MCV: 94 fL (ref 79–97)
MONOCYTES: 7 %
MONOS ABS: 0.6 10*3/uL (ref 0.1–0.9)
NEUTROS PCT: 56 %
Neutrophils Absolute: 4.8 10*3/uL (ref 1.4–7.0)
Platelets: 299 10*3/uL (ref 150–379)
RBC: 3.98 x10E6/uL (ref 3.77–5.28)
RDW: 13.5 % (ref 12.3–15.4)
WBC: 8.5 10*3/uL (ref 3.4–10.8)

## 2017-10-14 LAB — LIPID PANEL WITH LDL/HDL RATIO
CHOLESTEROL TOTAL: 202 mg/dL — AB (ref 100–199)
HDL: 60 mg/dL (ref >39)
LDL Calculated: 127 mg/dL — ABNORMAL HIGH (ref 0–99)
LDL/HDL RATIO: 2.1 ratio (ref 0.0–3.2)
TRIGLYCERIDES: 73 mg/dL (ref 0–149)
VLDL Cholesterol Cal: 15 mg/dL (ref 5–40)

## 2017-10-14 LAB — MAGNESIUM: Magnesium: 1.9 mg/dL (ref 1.6–2.3)

## 2017-10-14 LAB — T4, FREE: FREE T4: 1.32 ng/dL (ref 0.82–1.77)

## 2017-10-14 LAB — TSH: TSH: 1.41 u[IU]/mL (ref 0.450–4.500)

## 2017-10-17 ENCOUNTER — Ambulatory Visit
Admission: RE | Admit: 2017-10-17 | Discharge: 2017-10-17 | Disposition: A | Discharge status: Home / Self Care | Payer: 59 | Source: Ambulatory Visit | Attending: Internal Medicine | Admitting: Internal Medicine

## 2017-10-17 DIAGNOSIS — K219 Gastro-esophageal reflux disease without esophagitis: Secondary | ICD-10-CM | POA: Diagnosis present

## 2017-10-17 DIAGNOSIS — R1110 Vomiting, unspecified: Secondary | ICD-10-CM | POA: Diagnosis not present

## 2017-10-17 DIAGNOSIS — K30 Functional dyspepsia: Secondary | ICD-10-CM | POA: Diagnosis not present

## 2017-11-08 ENCOUNTER — Ambulatory Visit: Payer: Self-pay | Admitting: Internal Medicine

## 2017-11-08 NOTE — Progress Notes (Signed)
Can you check her next app

## 2017-11-27 ENCOUNTER — Other Ambulatory Visit: Payer: Self-pay

## 2017-11-27 MED ORDER — TRIAMTERENE-HCTZ 37.5-25 MG PO CAPS: 1.0000 | ORAL_CAPSULE | Freq: Every morning | ORAL | 1 refills | Status: DC

## 2017-12-12 ENCOUNTER — Ambulatory Visit: Payer: Self-pay | Admitting: Nurse Practitioner

## 2017-12-28 ENCOUNTER — Encounter: Payer: Self-pay | Admitting: Nurse Practitioner

## 2017-12-28 ENCOUNTER — Ambulatory Visit: Payer: Managed Care, Other (non HMO) | Admitting: Nurse Practitioner

## 2017-12-28 VITALS — BP 145/94 | HR 66 | Resp 16 | Ht 67.0 in | Wt 168.6 lb

## 2017-12-28 DIAGNOSIS — K219 Gastro-esophageal reflux disease without esophagitis: Secondary | ICD-10-CM | POA: Diagnosis not present

## 2017-12-28 DIAGNOSIS — I10 Essential (primary) hypertension: Secondary | ICD-10-CM | POA: Diagnosis not present

## 2017-12-28 MED ORDER — BENAZEPRIL HCL 10 MG PO TABS: 10.0000 mg | ORAL_TABLET | Freq: Every day | ORAL | 1 refills | Status: DC

## 2017-12-28 NOTE — Progress Notes (Signed)
[facility] [address] [place], [place] [address]  Internal MEDICINE  Office Visit Note  Patient Name: Tiffany Campbell  [id number]  [id number]  Date of Service: 01/17/2018   Pt is here for routine follow up.   Chief Complaint  Patient presents with  . Gastroesophageal Reflux    UGI review    The patient had been having epigastric pain with vomiting at her last visit. Was started on protonix 40mg  daily. An upper GI was done. This did show moderate GERD and mild slow gastric emptying. She reports her symptoms as being much better. No longer getting sick and throwing up after eating.  She has been off blood pressure medications for several months. BP was borderline but is getting higher.       Current Medication: Outpatient Encounter Medications as of 12/28/2017  Medication Sig  . pantoprazole (PROTONIX) 40 MG tablet Take 1 tablet (40 mg total) by mouth daily.  . benazepril (LOTENSIN) 10 MG tablet Take 1 tablet (10 mg total) by mouth daily.  . cephALEXin (KEFLEX) 500 MG capsule Take 1 capsule (500 mg total) by mouth 3 (three) times daily. (Patient not taking: Reported on 10/12/2017)  . phenazopyridine (PYRIDIUM) 200 MG tablet Take 1 tablet (200 mg total) by mouth 3 (three) times daily as needed for pain. (Patient not taking: Reported on 10/12/2017)  . potassium chloride (K-DUR) 10 MEQ tablet Take 1 tablet (10 mEq total) by mouth daily. (Patient not taking: Reported on 10/12/2017)  . [DISCONTINUED] benazepril (LOTENSIN) 40 MG tablet Take 40 mg by mouth daily.  . [DISCONTINUED] triamterene-hydrochlorothiazide (DYAZIDE) 37.5-25 MG capsule Take 1 each (1 capsule total) by mouth every morning. (Patient not taking: Reported on 12/28/2017)   No facility-administered encounter medications on file as of 12/28/2017.     Surgical History: Past Surgical History:  Procedure Laterality Date  . CHOLECYSTECTOMY    . TUBAL LIGATION      Medical History: Past Medical History:   Diagnosis Date  . Hypertension     Family History: Family History  Problem Relation Age of Onset  . Hypertension Mother   . Diabetes Father   . Diabetes Maternal Grandmother   . Diabetes Paternal Grandmother     Social History   Socioeconomic History  . Marital status: Single    Spouse name: Not on file  . Number of children: Not on file  . Years of education: Not on file  . Highest education level: Not on file  Occupational History  . Not on file  Social Needs  . Financial resource strain: Not on file  . Food insecurity:    Worry: Not on file    Inability: Not on file  . Transportation needs:    Medical: Not on file    Non-medical: Not on file  Tobacco Use  . Smoking status: Never Smoker  . Smokeless tobacco: Never Used  Substance and Sexual Activity  . Alcohol use: No  . Drug use: No  . Sexual activity: Not on file  Lifestyle  . Physical activity:    Days per week: Not on file    Minutes per session: Not on file  . Stress: Not on file  Relationships  . Social connections:    Talks on phone: Not on file    Gets together: Not on file    Attends religious service: Not on file    Active member of club or organization: Not on file    Attends meetings of clubs or  organizations: Not on file    Relationship status: Not on file  . Intimate partner violence:    Fear of current or ex partner: Not on file    Emotionally abused: Not on file    Physically abused: Not on file    Forced sexual activity: Not on file  Other Topics Concern  . Not on file  Social History Narrative  . Not on file      Review of Systems  Constitutional: Negative for chills, fatigue and unexpected weight change.  HENT: Negative for congestion, postnasal drip, rhinorrhea, sneezing and sore throat.   Eyes: Negative for redness.  Respiratory: Negative for cough, chest tightness and shortness of breath.   Cardiovascular: Negative for chest pain and palpitations.  Gastrointestinal:  Positive for abdominal pain and nausea. Negative for constipation, diarrhea and vomiting.  Genitourinary: Negative for dysuria and frequency.  Musculoskeletal: Negative for arthralgias, back pain, joint swelling and neck pain.  Skin: Negative for rash.  Neurological: Negative.  Negative for tremors and numbness.  Hematological: Negative for adenopathy. Does not bruise/bleed easily.  Psychiatric/Behavioral: Negative for behavioral problems (Depression), sleep disturbance and suicidal ideas. The patient is not nervous/anxious.    Today's Vitals   12/28/17 1459  BP: (!) 145/94  Pulse: 66  Resp: 16  SpO2: 97%  Weight: 168 lb 9.6 oz (76.5 kg)  Height: 5\' 7"  (1.702 m)    Physical Exam  Constitutional: She appears well-developed and well-nourished. No distress.  HENT:  Head: Normocephalic and atraumatic.  Right Ear: External ear normal.  Left Ear: External ear normal.  Nose: Nose normal.  Mouth/Throat: Oropharynx is clear and moist. No oropharyngeal exudate.  Eyes: Pupils are equal, round, and reactive to light. Conjunctivae and EOM are normal. Right eye exhibits no discharge. Left eye exhibits no discharge. No scleral icterus.  Neck: Normal range of motion. Neck supple. No JVD present. No tracheal deviation present. No thyromegaly present.  Cardiovascular: Normal rate, regular rhythm, normal heart sounds and intact distal pulses. Exam reveals no gallop and no friction rub.  No murmur heard. Pulmonary/Chest: Effort normal and breath sounds normal. No stridor. No respiratory distress. She has no wheezes. She has no rales. She exhibits no tenderness.  Abdominal: Soft. Bowel sounds are normal. She exhibits no distension and no mass. There is no tenderness. There is no rebound and no guarding.  Genitourinary: Vagina normal and uterus normal. No vaginal discharge found.  Musculoskeletal: Normal range of motion. She exhibits no edema, tenderness or deformity.  Lymphadenopathy:    She has no  cervical adenopathy.  Neurological: She is alert. She has normal reflexes. No cranial nerve deficit. She exhibits normal muscle tone. Coordination normal.  Skin: Skin is warm and dry. Capillary refill takes less than 2 seconds. No rash noted. She is not diaphoretic. No erythema. No pallor.  Psychiatric: She has a normal mood and affect. Her behavior is normal. Judgment and thought content normal.  Nursing note and vitals reviewed.  Assessment/Plan: 1. Essential hypertension Add back benazapril 10mg  daily for blood pressure control. - benazepril (LOTENSIN) 10 MG tablet; Take 1 tablet (10 mg total) by mouth daily.  Dispense: 90 tablet; Refill: 1  2. Gastroesophageal reflux disease without esophagitis Reviewed UGI showing moderate GERD with mild delayed emptying. Symptoms improved since starting on protonix and avoiding trigger foods. Will continue for now and refer to GI in future as indicated.   General Counseling: Henna verbalizes understanding of the findings of todays visit and agrees with plan of  treatment. I have discussed any further diagnostic evaluation that may be needed or ordered today. We also reviewed her medications today. she has been encouraged to call the office with any questions or concerns that should arise related to todays visit.    Counseling:  Hypertension Counseling:   The following hypertensive lifestyle modification were recommended and discussed:  1. Limiting alcohol intake to less than 1 oz/day of ethanol:(24 oz of beer or 8 oz of wine or 2 oz of 100-proof whiskey). 2. Take baby ASA 81 mg daily. 3. Importance of regular aerobic exercise and losing weight. 4. Reduce dietary saturated fat and cholesterol intake for overall cardiovascular health. 5. Maintaining adequate dietary potassium, calcium, and magnesium intake. 6. Regular monitoring of the blood pressure. 7. Reduce sodium intake to less than 100 mmol/day (less than 2.3 gm of sodium or less than 6 gm of  sodium choride)   This patient was seen by Vincent Gros, FNP- C in Collaboration with Dr Lyndon Code as a part of collaborative care agreement  Meds ordered this encounter  Medications  . benazepril (LOTENSIN) 10 MG tablet    Sig: Take 1 tablet (10 mg total) by mouth daily.    Dispense:  90 tablet    Refill:  1    Order Specific Question:   Supervising Provider    Answer:   Lyndon Code [[id number]]    Time spent: 23 Minutes          Dr Lyndon Code Internal medicine

## 2018-01-17 DIAGNOSIS — I10 Essential (primary) hypertension: Secondary | ICD-10-CM | POA: Insufficient documentation

## 2018-01-17 DIAGNOSIS — K219 Gastro-esophageal reflux disease without esophagitis: Secondary | ICD-10-CM | POA: Insufficient documentation

## 2018-01-17 HISTORY — DX: Gastro-esophageal reflux disease without esophagitis: K21.9

## 2018-02-06 ENCOUNTER — Ambulatory Visit: Payer: Self-pay | Admitting: Nurse Practitioner

## 2018-02-22 ENCOUNTER — Encounter: Payer: Self-pay | Admitting: Nurse Practitioner

## 2018-02-22 ENCOUNTER — Ambulatory Visit: Payer: Managed Care, Other (non HMO) | Admitting: Nurse Practitioner

## 2018-02-22 VITALS — BP 140/80 | HR 68 | Resp 16 | Ht 67.0 in | Wt 168.0 lb

## 2018-02-22 DIAGNOSIS — R5383 Other fatigue: Secondary | ICD-10-CM | POA: Diagnosis not present

## 2018-02-22 DIAGNOSIS — I10 Essential (primary) hypertension: Secondary | ICD-10-CM | POA: Diagnosis not present

## 2018-02-22 DIAGNOSIS — K649 Unspecified hemorrhoids: Secondary | ICD-10-CM | POA: Diagnosis not present

## 2018-02-22 DIAGNOSIS — F5101 Primary insomnia: Secondary | ICD-10-CM

## 2018-02-22 MED ORDER — PRAMOXINE-HC 1-2.5 % EX CREA: TOPICAL_CREAM | CUTANEOUS | 0 refills | Status: DC

## 2018-02-22 MED ORDER — TRAZODONE HCL 50 MG PO TABS: 25.0000 mg | ORAL_TABLET | Freq: Every evening | ORAL | 3 refills | Status: DC | PRN

## 2018-02-22 NOTE — Progress Notes (Signed)
[facility] [address] [place], [place] [address]  Internal MEDICINE  Office Visit Note  Patient Name: Tiffany Campbell  [id number]  [id number]  Date of Service: 03/11/2018  Chief Complaint  Patient presents with  . Hypertension    follow up for BP  . Hemorrhoids    Hypertension  This is a chronic problem. The current episode started more than 1 year ago. The problem has been gradually improving since onset. The problem is controlled. Pertinent negatives include no chest pain, headaches, neck pain, palpitations or shortness of breath. There are no associated agents to hypertension. Risk factors for coronary artery disease include family history. Past treatments include ACE inhibitors. The current treatment provides moderate improvement. There are no compliance problems.        Current Medication: Outpatient Encounter Medications as of 02/22/2018  Medication Sig  . benazepril (LOTENSIN) 10 MG tablet Take 1 tablet (10 mg total) by mouth daily.  . pantoprazole (PROTONIX) 40 MG tablet Take 1 tablet (40 mg total) by mouth daily.  . [DISCONTINUED] potassium chloride (K-DUR) 10 MEQ tablet Take 1 tablet (10 mEq total) by mouth daily.  . pramoxine-hydrocortisone cream Apply to affected area 2 to 3 times daily as needed.  . traZODone (DESYREL) 50 MG tablet Take 0.5-1 tablets (25-50 mg total) by mouth at bedtime as needed for sleep.  . [DISCONTINUED] cephALEXin (KEFLEX) 500 MG capsule Take 1 capsule (500 mg total) by mouth 3 (three) times daily. (Patient not taking: Reported on 02/22/2018)  . [DISCONTINUED] phenazopyridine (PYRIDIUM) 200 MG tablet Take 1 tablet (200 mg total) by mouth 3 (three) times daily as needed for pain. (Patient not taking: Reported on 02/22/2018)   No facility-administered encounter medications on file as of 02/22/2018.     Surgical History: Past Surgical History:  Procedure Laterality Date  . CHOLECYSTECTOMY    . TUBAL LIGATION      Medical  History: Past Medical History:  Diagnosis Date  . Hypertension     Family History: Family History  Problem Relation Age of Onset  . Hypertension Mother   . Diabetes Father   . Diabetes Maternal Grandmother   . Diabetes Paternal Grandmother     Social History   Socioeconomic History  . Marital status: Single    Spouse name: Not on file  . Number of children: Not on file  . Years of education: Not on file  . Highest education level: Not on file  Occupational History  . Not on file  Social Needs  . Financial resource strain: Not on file  . Food insecurity:    Worry: Not on file    Inability: Not on file  . Transportation needs:    Medical: Not on file    Non-medical: Not on file  Tobacco Use  . Smoking status: Never Smoker  . Smokeless tobacco: Never Used  Substance and Sexual Activity  . Alcohol use: No  . Drug use: No  . Sexual activity: Not on file  Lifestyle  . Physical activity:    Days per week: Not on file    Minutes per session: Not on file  . Stress: Not on file  Relationships  . Social connections:    Talks on phone: Not on file    Gets together: Not on file    Attends religious service: Not on file    Active member of club or organization: Not on file    Attends meetings of clubs or organizations: Not on file  Relationship status: Not on file  . Intimate partner violence:    Fear of current or ex partner: Not on file    Emotionally abused: Not on file    Physically abused: Not on file    Forced sexual activity: Not on file  Other Topics Concern  . Not on file  Social History Narrative  . Not on file      Review of Systems  Constitutional: Negative for chills, fatigue and unexpected weight change.  HENT: Negative for congestion, postnasal drip, rhinorrhea, sneezing and sore throat.   Eyes: Negative.  Negative for redness.  Respiratory: Negative for cough, chest tightness and shortness of breath.   Cardiovascular: Negative for chest pain  and palpitations.       Improved blood pressure  Gastrointestinal: Positive for nausea. Negative for abdominal pain, constipation, diarrhea and vomiting.       Hemorrhoids  Endocrine: Negative for cold intolerance, heat intolerance, polydipsia and polyphagia.  Genitourinary: Negative for dysuria and frequency.  Musculoskeletal: Negative for arthralgias, back pain, joint swelling and neck pain.  Skin: Negative for rash.  Allergic/Immunologic: Negative for environmental allergies.  Neurological: Negative.  Negative for dizziness, tremors, numbness and headaches.  Hematological: Negative for adenopathy. Does not bruise/bleed easily.  Psychiatric/Behavioral: Positive for sleep disturbance. Negative for behavioral problems (Depression) and suicidal ideas. The patient is not nervous/anxious.     Today's Vitals   02/22/18 1524  BP: 140/80  Pulse: 68  Resp: 16  SpO2: 98%  Weight: 168 lb (76.2 kg)  Height: 5\' 7"  (1.702 m)    Physical Exam  Constitutional: She is oriented to person, place, and time. She appears well-developed and well-nourished. No distress.  HENT:  Head: Normocephalic and atraumatic.  Nose: Nose normal.  Mouth/Throat: Oropharynx is clear and moist. No oropharyngeal exudate.  Eyes: Pupils are equal, round, and reactive to light. Conjunctivae and EOM are normal.  Neck: Normal range of motion. Neck supple. No JVD present. No tracheal deviation present. No thyromegaly present.  Cardiovascular: Normal rate, regular rhythm and normal heart sounds. Exam reveals no gallop and no friction rub.  No murmur heard. Pulmonary/Chest: Effort normal and breath sounds normal. No respiratory distress. She has no wheezes. She has no rales. She exhibits no tenderness.  Abdominal: Soft. Bowel sounds are normal.  Musculoskeletal: Normal range of motion.  Lymphadenopathy:    She has no cervical adenopathy.  Neurological: She is alert and oriented to person, place, and time. No cranial nerve  deficit.  Skin: Skin is warm and dry. Capillary refill takes less than 2 seconds. She is not diaphoretic.  Psychiatric: She has a normal mood and affect. Her behavior is normal. Judgment and thought content normal.  Nursing note and vitals reviewed.  Assessment/Plan: 1. Essential hypertension Improved. Continue benazepril as prescribed.   2. Hemorrhoids, unspecified hemorrhoid type Add pramoxine/HC cream to affected area 2/3 times per day, but especially after bowel movements. Increase fiber in the diet. Recommend use of stool softener as needed.  - pramoxine-hydrocortisone cream; Apply to affected area 2 to 3 times daily as needed.  Dispense: 57 g; Refill: 0  3. Primary insomnia Add trazodone 50mg , taking 1/2 to 1 tablet at bedtime as needed. Reviewed good sleep hygiene habits to improve sleep quality.  - traZODone (DESYREL) 50 MG tablet; Take 0.5-1 tablets (25-50 mg total) by mouth at bedtime as needed for sleep.  Dispense: 30 tablet; Refill: 3  4. Other fatigue Likely from sleeping difficulty, but will check CBC and thyroid  panel.  - CBC, No Differential/Platelet - TSH + free T4  General Counseling: Shemeka verbalizes understanding of the findings of todays visit and agrees with plan of treatment. I have discussed any further diagnostic evaluation that may be needed or ordered today. We also reviewed her medications today. she has been encouraged to call the office with any questions or concerns that should arise related to todays visit.  Hypertension Counseling:   The following hypertensive lifestyle modification were recommended and discussed:  1. Limiting alcohol intake to less than 1 oz/day of ethanol:(24 oz of beer or 8 oz of wine or 2 oz of 100-proof whiskey). 2. Take baby ASA 81 mg daily. 3. Importance of regular aerobic exercise and losing weight. 4. Reduce dietary saturated fat and cholesterol intake for overall cardiovascular health. 5. Maintaining adequate dietary  potassium, calcium, and magnesium intake. 6. Regular monitoring of the blood pressure. 7. Reduce sodium intake to less than 100 mmol/day (less than 2.3 gm of sodium or less than 6 gm of sodium choride)   This patient was seen by Vincent Gros FNP Collaboration with Dr Lyndon Code as a part of collaborative care agreement  Orders Placed This Encounter  Procedures  . CBC, No Differential/Platelet  . TSH + free T4    Meds ordered this encounter  Medications  . traZODone (DESYREL) 50 MG tablet    Sig: Take 0.5-1 tablets (25-50 mg total) by mouth at bedtime as needed for sleep.    Dispense:  30 tablet    Refill:  3    Order Specific Question:   Supervising Provider    Answer:   Lyndon Code [[id number]]  . pramoxine-hydrocortisone cream    Sig: Apply to affected area 2 to 3 times daily as needed.    Dispense:  57 g    Refill:  0    Order Specific Question:   Supervising Provider    Answer:   Lyndon Code [[id number]]    Time spent: 68 Minutes      Dr Lyndon Code Internal medicine

## 2018-03-11 DIAGNOSIS — R5383 Other fatigue: Secondary | ICD-10-CM | POA: Insufficient documentation

## 2018-03-11 DIAGNOSIS — F5101 Primary insomnia: Secondary | ICD-10-CM | POA: Insufficient documentation

## 2018-03-11 DIAGNOSIS — K649 Unspecified hemorrhoids: Secondary | ICD-10-CM | POA: Insufficient documentation

## 2018-03-28 ENCOUNTER — Other Ambulatory Visit: Payer: Self-pay

## 2018-03-28 DIAGNOSIS — K219 Gastro-esophageal reflux disease without esophagitis: Secondary | ICD-10-CM

## 2018-03-28 MED ORDER — PANTOPRAZOLE SODIUM 40 MG PO TBEC: 40.0000 mg | DELAYED_RELEASE_TABLET | Freq: Every day | ORAL | 1 refills | Status: DC

## 2018-07-30 ENCOUNTER — Other Ambulatory Visit: Payer: Self-pay

## 2018-07-30 DIAGNOSIS — I10 Essential (primary) hypertension: Secondary | ICD-10-CM

## 2018-07-30 MED ORDER — BENAZEPRIL HCL 10 MG PO TABS: 10.0000 mg | ORAL_TABLET | Freq: Every day | ORAL | 0 refills | Status: DC

## 2018-08-30 ENCOUNTER — Ambulatory Visit: Payer: Managed Care, Other (non HMO) | Admitting: Nurse Practitioner

## 2018-08-30 ENCOUNTER — Encounter: Payer: Self-pay | Admitting: Nurse Practitioner

## 2018-08-30 VITALS — BP 148/92 | HR 81 | Resp 16 | Ht 67.0 in | Wt 158.4 lb

## 2018-08-30 DIAGNOSIS — K219 Gastro-esophageal reflux disease without esophagitis: Secondary | ICD-10-CM | POA: Diagnosis not present

## 2018-08-30 DIAGNOSIS — R5383 Other fatigue: Secondary | ICD-10-CM | POA: Diagnosis not present

## 2018-08-30 DIAGNOSIS — I10 Essential (primary) hypertension: Secondary | ICD-10-CM

## 2018-08-30 MED ORDER — METOCLOPRAMIDE HCL 5 MG PO TABS: 5.0000 mg | ORAL_TABLET | Freq: Three times a day (TID) | ORAL | 2 refills | Status: DC | PRN

## 2018-08-30 NOTE — Progress Notes (Signed)
[facility] [address] [place], [place] [address]  Internal MEDICINE  Office Visit Note  Patient Name: Tiffany Campbell  [id number]  [id number]  Date of Service: 09/05/2018  Chief Complaint  Patient presents with  . Medical Management of Chronic Issues    6 month follow up, pt has had the shakes today, she doesnt have an appetite to eat,   . Hypertension    The patient had been having epigastric pain with vomiting during a recent visit.  Was started on protonix 40mg  daily. An upper GI was done. This did show moderate GERD and mild slow gastric emptying. Addition of protonix did help for some time, however, symptoms are starting to return. Continues to take protonix every day. Feels like food, water, pills, are getting stuck in the bottom of her esophagus. Gets very bloated and feels  Very full quickly. Has been unable to eat normally due to these feelings of bloating.  Blood pressure is well controlled with current medications, however, it is a little elvated today.       Current Medication: Outpatient Encounter Medications as of 08/30/2018  Medication Sig  . benazepril (LOTENSIN) 10 MG tablet Take 1 tablet (10 mg total) by mouth daily.  . pantoprazole (PROTONIX) 40 MG tablet Take 1 tablet (40 mg total) by mouth daily.  . pramoxine-hydrocortisone cream Apply to affected area 2 to 3 times daily as needed.  . traZODone (DESYREL) 50 MG tablet Take 0.5-1 tablets (25-50 mg total) by mouth at bedtime as needed for sleep.  . metoCLOPramide (REGLAN) 5 MG tablet Take 1 tablet (5 mg total) by mouth every 8 (eight) hours as needed for nausea.   No facility-administered encounter medications on file as of 08/30/2018.     Surgical History: Past Surgical History:  Procedure Laterality Date  . CHOLECYSTECTOMY    . TUBAL LIGATION      Medical History: Past Medical History:  Diagnosis Date  . Hypertension     Family History: Family History  Problem Relation Age of Onset   . Hypertension Mother   . Diabetes Father   . Diabetes Maternal Grandmother   . Diabetes Paternal Grandmother     Social History   Socioeconomic History  . Marital status: Single    Spouse name: Not on file  . Number of children: Not on file  . Years of education: Not on file  . Highest education level: Not on file  Occupational History  . Not on file  Social Needs  . Financial resource strain: Not on file  . Food insecurity:    Worry: Not on file    Inability: Not on file  . Transportation needs:    Medical: Not on file    Non-medical: Not on file  Tobacco Use  . Smoking status: Never Smoker  . Smokeless tobacco: Never Used  Substance and Sexual Activity  . Alcohol use: No  . Drug use: No  . Sexual activity: Not on file  Lifestyle  . Physical activity:    Days per week: Not on file    Minutes per session: Not on file  . Stress: Not on file  Relationships  . Social connections:    Talks on phone: Not on file    Gets together: Not on file    Attends religious service: Not on file    Active member of club or organization: Not on file    Attends meetings of clubs or organizations: Not on file  Relationship status: Not on file  . Intimate partner violence:    Fear of current or ex partner: Not on file    Emotionally abused: Not on file    Physically abused: Not on file    Forced sexual activity: Not on file  Other Topics Concern  . Not on file  Social History Narrative  . Not on file      Review of Systems  Constitutional: Positive for appetite change. Negative for chills, fatigue and unexpected weight change.  HENT: Negative for congestion, postnasal drip, rhinorrhea, sneezing and sore throat.   Respiratory: Negative for cough, chest tightness and shortness of breath.   Cardiovascular: Negative for chest pain and palpitations.  Gastrointestinal: Positive for abdominal distention, abdominal pain, constipation and nausea. Negative for diarrhea and  vomiting.       Early satiety  Endocrine: Negative for cold intolerance, heat intolerance, polydipsia and polyuria.  Musculoskeletal: Negative for arthralgias, back pain, joint swelling and neck pain.  Skin: Negative for rash.  Allergic/Immunologic: Negative for environmental allergies.  Neurological: Negative for dizziness, tremors, numbness and headaches.  Hematological: Negative for adenopathy. Does not bruise/bleed easily.  Psychiatric/Behavioral: Negative for behavioral problems (Depression), sleep disturbance and suicidal ideas. The patient is not nervous/anxious.    Today's Vitals   08/30/18 1516  BP: (!) 148/92  Pulse: 81  Resp: 16  SpO2: 100%  Weight: 158 lb 6.4 oz (71.8 kg)  Height: 5\' 7"  (1.702 m)   Body mass index is 24.81 kg/m.  Physical Exam Vitals signs and nursing note reviewed.  Constitutional:      General: She is not in acute distress.    Appearance: Normal appearance. She is well-developed. She is not diaphoretic.  HENT:     Head: Normocephalic and atraumatic.     Nose: Nose normal.     Mouth/Throat:     Pharynx: No oropharyngeal exudate.  Eyes:     Conjunctiva/sclera: Conjunctivae normal.     Pupils: Pupils are equal, round, and reactive to light.  Neck:     Musculoskeletal: Normal range of motion and neck supple.     Thyroid: No thyromegaly.     Vascular: No JVD.     Trachea: No tracheal deviation.  Cardiovascular:     Rate and Rhythm: Normal rate and regular rhythm.     Heart sounds: Normal heart sounds. No murmur. No friction rub. No gallop.   Pulmonary:     Effort: Pulmonary effort is normal. No respiratory distress.     Breath sounds: Normal breath sounds. No wheezing or rales.  Chest:     Chest wall: No tenderness.  Abdominal:     General: Bowel sounds are normal.     Palpations: Abdomen is soft.     Tenderness: There is abdominal tenderness.  Musculoskeletal: Normal range of motion.  Lymphadenopathy:     Cervical: No cervical  adenopathy.  Skin:    General: Skin is warm and dry.     Capillary Refill: Capillary refill takes less than 2 seconds.  Neurological:     Mental Status: She is alert and oriented to person, place, and time.     Cranial Nerves: No cranial nerve deficit.  Psychiatric:        Behavior: Behavior normal.        Thought Content: Thought content normal.        Judgment: Judgment normal.   Assessment/Plan:  1. Gastroesophageal reflux disease without esophagitis Will add reglan 5mg  up to three times daily  prior to meals.  - metoCLOPramide (REGLAN) 5 MG tablet; Take 1 tablet (5 mg total) by mouth every 8 (eight) hours as needed for nausea.  Dispense: 90 tablet; Refill: 2  2. Other fatigue Will check labs for further evaluation.   3. Essential hypertension Stable. Continue bp medication as prescribed .  General Counseling: Dellene verbalizes understanding of the findings of todays visit and agrees with plan of treatment. I have discussed any further diagnostic evaluation that may be needed or ordered today. We also reviewed her medications today. she has been encouraged to call the office with any questions or concerns that should arise related to todays visit.  The 'BRAT' diet is suggested, then progress to diet as tolerated as symptoms abate. Call if bloody stools, persistent diarrhea, vomiting, fever or abdominal pain.  Hypertension Counseling:   The following hypertensive lifestyle modification were recommended and discussed:  1. Limiting alcohol intake to less than 1 oz/day of ethanol:(24 oz of beer or 8 oz of wine or 2 oz of 100-proof whiskey). 2. Take baby ASA 81 mg daily. 3. Importance of regular aerobic exercise and losing weight. 4. Reduce dietary saturated fat and cholesterol intake for overall cardiovascular health. 5. Maintaining adequate dietary potassium, calcium, and magnesium intake. 6. Regular monitoring of the blood pressure. 7. Reduce sodium intake to less than 100 mmol/day  (less than 2.3 gm of sodium or less than 6 gm of sodium choride)   This patient was seen by Vincent Gros FNP Collaboration with Dr Lyndon Code as a part of collaborative care agreement   Meds ordered this encounter  Medications  . metoCLOPramide (REGLAN) 5 MG tablet    Sig: Take 1 tablet (5 mg total) by mouth every 8 (eight) hours as needed for nausea.    Dispense:  90 tablet    Refill:  2    Order Specific Question:   Supervising Provider    Answer:   Lyndon Code [[id number]]    Time spent: 19 Minutes      Dr Lyndon Code Internal medicine

## 2018-08-30 NOTE — Progress Notes (Signed)
Pt blood pressure was elevated, checked two times.

## 2018-09-25 ENCOUNTER — Other Ambulatory Visit: Payer: Self-pay | Admitting: Nurse Practitioner

## 2018-09-26 ENCOUNTER — Other Ambulatory Visit: Payer: Self-pay | Admitting: Nurse Practitioner

## 2018-09-26 DIAGNOSIS — E559 Vitamin D deficiency, unspecified: Secondary | ICD-10-CM

## 2018-09-26 LAB — COMPREHENSIVE METABOLIC PANEL
ALBUMIN: 4.4 g/dL (ref 3.8–4.8)
ALK PHOS: 54 IU/L (ref 39–117)
ALT: 9 IU/L (ref 0–32)
AST: 9 IU/L (ref 0–40)
Albumin/Globulin Ratio: 1.7 (ref 1.2–2.2)
BUN / CREAT RATIO: 10 (ref 9–23)
BUN: 9 mg/dL (ref 6–20)
Bilirubin Total: 0.5 mg/dL (ref 0.0–1.2)
CO2: 21 mmol/L (ref 20–29)
CREATININE: 0.93 mg/dL (ref 0.57–1.00)
Calcium: 9.1 mg/dL (ref 8.7–10.2)
Chloride: 104 mmol/L (ref 96–106)
GFR calc non Af Amer: 81 mL/min/{1.73_m2} (ref >59)
GFR, EST AFRICAN AMERICAN: 93 mL/min/{1.73_m2} (ref >59)
GLOBULIN, TOTAL: 2.6 g/dL (ref 1.5–4.5)
Glucose: 112 mg/dL — ABNORMAL HIGH (ref 65–99)
Potassium: 4 mmol/L (ref 3.5–5.2)
SODIUM: 140 mmol/L (ref 134–144)
TOTAL PROTEIN: 7 g/dL (ref 6.0–8.5)

## 2018-09-26 LAB — CBC
Hematocrit: 32.7 % — ABNORMAL LOW (ref 34.0–46.6)
Hemoglobin: 10.9 g/dL — ABNORMAL LOW (ref 11.1–15.9)
MCH: 30.7 pg (ref 26.6–33.0)
MCHC: 33.3 g/dL (ref 31.5–35.7)
MCV: 92 fL (ref 79–97)
Platelets: 283 10*3/uL (ref 150–450)
RBC: 3.55 x10E6/uL — ABNORMAL LOW (ref 3.77–5.28)
RDW: 13.8 % (ref 11.7–15.4)
WBC: 5.7 10*3/uL (ref 3.4–10.8)

## 2018-09-26 LAB — B12 AND FOLATE PANEL
FOLATE: 8.9 ng/mL (ref >3.0)
VITAMIN B 12: 512 pg/mL (ref 232–1245)

## 2018-09-26 LAB — FERRITIN: Ferritin: 6 ng/mL — ABNORMAL LOW (ref 15–150)

## 2018-09-26 LAB — T3: T3, Total: 95 ng/dL (ref 71–180)

## 2018-09-26 LAB — HGB A1C W/O EAG: Hgb A1c MFr Bld: 5.3 % (ref 4.8–5.6)

## 2018-09-26 LAB — TSH: TSH: 1.11 u[IU]/mL (ref 0.450–4.500)

## 2018-09-26 LAB — T4, FREE: Free T4: 1.4 ng/dL (ref 0.82–1.77)

## 2018-09-26 LAB — VITAMIN D 25 HYDROXY (VIT D DEFICIENCY, FRACTURES): Vit D, 25-Hydroxy: 12 ng/mL — ABNORMAL LOW (ref 30.0–100.0)

## 2018-09-26 MED ORDER — ERGOCALCIFEROL 1.25 MG (50000 UT) PO CAPS: 50000.0000 [IU] | ORAL_CAPSULE | ORAL | 5 refills | Status: DC

## 2018-09-26 NOTE — Progress Notes (Signed)
Vitamin d low on labs. Added drisdol 50000iu weekly and sent this new prescription to [facility]. She was also slightly anemic. I recommend she take OTC iron supplement every day. Other blood work looked pretty good.

## 2018-09-27 ENCOUNTER — Ambulatory Visit (INDEPENDENT_AMBULATORY_CARE_PROVIDER_SITE_OTHER): Payer: Managed Care, Other (non HMO) | Admitting: Nurse Practitioner

## 2018-09-27 VITALS — BP 143/89 | HR 75 | Resp 16 | Ht 67.0 in | Wt 160.8 lb

## 2018-09-27 DIAGNOSIS — E559 Vitamin D deficiency, unspecified: Secondary | ICD-10-CM

## 2018-09-27 DIAGNOSIS — Z0001 Encounter for general adult medical examination with abnormal findings: Secondary | ICD-10-CM

## 2018-09-27 DIAGNOSIS — K219 Gastro-esophageal reflux disease without esophagitis: Secondary | ICD-10-CM | POA: Diagnosis not present

## 2018-09-27 DIAGNOSIS — R5383 Other fatigue: Secondary | ICD-10-CM

## 2018-09-27 DIAGNOSIS — I10 Essential (primary) hypertension: Secondary | ICD-10-CM

## 2018-11-04 ENCOUNTER — Encounter: Payer: Self-pay | Admitting: Nurse Practitioner

## 2018-11-04 DIAGNOSIS — E559 Vitamin D deficiency, unspecified: Secondary | ICD-10-CM | POA: Insufficient documentation

## 2018-11-04 DIAGNOSIS — Z0001 Encounter for general adult medical examination with abnormal findings: Secondary | ICD-10-CM | POA: Insufficient documentation

## 2018-11-04 NOTE — Progress Notes (Signed)
[facility] [address] [place], [place] [address]  Internal MEDICINE  Office Visit Note  Patient Name: Tiffany Campbell  [id number]  [id number]  Date of Service: 11/04/2018    Pt is here for routine health maintenance examination  Chief Complaint  Patient presents with  . Annual Exam  . Labs Only    review labs      The patient is here for routine health maintenance exam. She has had labs drawn prior to this visit. She does have vitamin d deficiency. She states that her GERD is doing well and got much better with the addition of Reglan 5mg  prior to meals. Blood pressure is well controlled.     Current Medication: Outpatient Encounter Medications as of 09/27/2018  Medication Sig  . benazepril (LOTENSIN) 10 MG tablet Take 1 tablet (10 mg total) by mouth daily.  . ergocalciferol (DRISDOL) 1.25 MG (50000 UT) capsule Take 1 capsule (50,000 Units total) by mouth once a week.  . metoCLOPramide (REGLAN) 5 MG tablet Take 1 tablet (5 mg total) by mouth every 8 (eight) hours as needed for nausea.  . pantoprazole (PROTONIX) 40 MG tablet Take 1 tablet (40 mg total) by mouth daily.  . pramoxine-hydrocortisone cream Apply to affected area 2 to 3 times daily as needed.  . traZODone (DESYREL) 50 MG tablet Take 0.5-1 tablets (25-50 mg total) by mouth at bedtime as needed for sleep.   No facility-administered encounter medications on file as of 09/27/2018.     Surgical History: Past Surgical History:  Procedure Laterality Date  . CHOLECYSTECTOMY    . TUBAL LIGATION      Medical History: Past Medical History:  Diagnosis Date  . Hypertension     Family History: Family History  Problem Relation Age of Onset  . Hypertension Mother   . Diabetes Father   . Diabetes Maternal Grandmother   . Diabetes Paternal Grandmother       Review of Systems  Constitutional: Negative for appetite change, chills, fatigue and unexpected weight change.  HENT: Negative for congestion,  postnasal drip, rhinorrhea, sneezing and sore throat.   Respiratory: Negative for cough, chest tightness and shortness of breath.   Cardiovascular: Negative for chest pain and palpitations.  Gastrointestinal: Negative for abdominal distention, abdominal pain, constipation, diarrhea, nausea and vomiting.       Improved GERD symptoms with addition of Reglan and continuance of protonix.   Endocrine: Negative for cold intolerance, heat intolerance, polydipsia and polyuria.  Genitourinary: Negative for enuresis, frequency and menstrual problem.  Musculoskeletal: Negative for arthralgias, back pain, joint swelling and neck pain.  Skin: Negative for rash.  Allergic/Immunologic: Negative for environmental allergies.  Neurological: Negative for dizziness, tremors, numbness and headaches.  Hematological: Negative for adenopathy. Does not bruise/bleed easily.  Psychiatric/Behavioral: Negative for behavioral problems (Depression), sleep disturbance and suicidal ideas. The patient is not nervous/anxious.     Today's Vitals   09/27/18 1554  BP: (!) 143/89  Pulse: 75  Resp: 16  SpO2: 99%  Weight: 160 lb 12.8 oz (72.9 kg)  Height: 5\' 7"  (1.702 m)   Body mass index is 25.18 kg/m.  Physical Exam Vitals signs and nursing note reviewed.  Constitutional:      General: She is not in acute distress.    Appearance: Normal appearance. She is well-developed. She is not diaphoretic.  HENT:     Head: Normocephalic and atraumatic.     Nose: Nose normal.     Mouth/Throat:     Pharynx:  No oropharyngeal exudate.  Eyes:     Conjunctiva/sclera: Conjunctivae normal.     Pupils: Pupils are equal, round, and reactive to light.  Neck:     Musculoskeletal: Normal range of motion and neck supple.     Thyroid: No thyromegaly.     Vascular: No JVD.     Trachea: No tracheal deviation.  Cardiovascular:     Rate and Rhythm: Normal rate and regular rhythm.     Pulses: Normal pulses.     Heart sounds: Normal heart  sounds. No murmur. No friction rub. No gallop.   Pulmonary:     Effort: Pulmonary effort is normal. No respiratory distress.     Breath sounds: Normal breath sounds. No wheezing or rales.  Chest:     Chest wall: No tenderness.     Breasts:        Right: Normal. No swelling, bleeding, inverted nipple, mass, nipple discharge, skin change or tenderness.        Left: Normal. No swelling, bleeding, inverted nipple, mass, nipple discharge, skin change or tenderness.  Abdominal:     General: Bowel sounds are normal.     Palpations: Abdomen is soft.     Tenderness: There is no abdominal tenderness.  Musculoskeletal: Normal range of motion.  Lymphadenopathy:     Cervical: No cervical adenopathy.  Skin:    General: Skin is warm and dry.     Capillary Refill: Capillary refill takes less than 2 seconds.  Neurological:     Mental Status: She is alert and oriented to person, place, and time.     Cranial Nerves: No cranial nerve deficit.  Psychiatric:        Behavior: Behavior normal.        Thought Content: Thought content normal.        Judgment: Judgment normal.      LABS: Recent Results (from the past 2160 hour(s))  Comprehensive metabolic panel     Status: Abnormal   Collection Time: 09/25/18  1:23 PM  Result Value Ref Range   Glucose 112 (H) 65 - 99 mg/dL   BUN 9 6 - 20 mg/dL   Creatinine, Ser 9.45 0.57 - 1.00 mg/dL   GFR calc non Af Amer 81 >59 mL/min/1.73   GFR calc Af Amer 93 >59 mL/min/1.73   BUN/Creatinine Ratio 10 9 - 23   Sodium 140 134 - 144 mmol/L   Potassium 4.0 3.5 - 5.2 mmol/L   Chloride 104 96 - 106 mmol/L   CO2 21 20 - 29 mmol/L   Calcium 9.1 8.7 - 10.2 mg/dL   Total Protein 7.0 6.0 - 8.5 g/dL   Albumin 4.4 3.8 - 4.8 g/dL   Globulin, Total 2.6 1.5 - 4.5 g/dL   Albumin/Globulin Ratio 1.7 1.2 - 2.2   Bilirubin Total 0.5 0.0 - 1.2 mg/dL   Alkaline Phosphatase 54 39 - 117 IU/L   AST 9 0 - 40 IU/L   ALT 9 0 - 32 IU/L  CBC     Status: Abnormal   Collection Time:  09/25/18  1:23 PM  Result Value Ref Range   WBC 5.7 3.4 - 10.8 x10E3/uL   RBC 3.55 (L) 3.77 - 5.28 x10E6/uL   Hemoglobin 10.9 (L) 11.1 - 15.9 g/dL   Hematocrit 03.8 (L) 88.2 - 46.6 %   MCV 92 79 - 97 fL   MCH 30.7 26.6 - 33.0 pg   MCHC 33.3 31.5 - 35.7 g/dL   RDW 80.0 34.9 - 17.9 %  Platelets 283 150 - 450 x10E3/uL  B12 and Folate Panel     Status: None   Collection Time: 09/25/18  1:23 PM  Result Value Ref Range   Vitamin B-12 512 232 - 1,245 pg/mL   Folate 8.9 >3.0 ng/mL    Comment: A serum folate concentration of less than 3.1 ng/mL is considered to represent clinical deficiency.   Hgb A1c w/o eAG     Status: None   Collection Time: 09/25/18  1:23 PM  Result Value Ref Range   Hgb A1c MFr Bld 5.3 4.8 - 5.6 %    Comment:          Prediabetes: 5.7 - 6.4          Diabetes: >6.4          Glycemic control for adults with diabetes: <7.0   T4, free     Status: None   Collection Time: 09/25/18  1:23 PM  Result Value Ref Range   Free T4 1.40 0.82 - 1.77 ng/dL  TSH     Status: None   Collection Time: 09/25/18  1:23 PM  Result Value Ref Range   TSH 1.110 0.450 - 4.500 uIU/mL  VITAMIN D 25 Hydroxy (Vit-D Deficiency, Fractures)     Status: Abnormal   Collection Time: 09/25/18  1:23 PM  Result Value Ref Range   Vit D, 25-Hydroxy 12.0 (L) 30.0 - 100.0 ng/mL    Comment: Vitamin D deficiency has been defined by the Institute of Medicine and an Endocrine Society practice guideline as a level of serum 25-OH vitamin D less than 20 ng/mL (1,2). The Endocrine Society went on to further define vitamin D insufficiency as a level between 21 and 29 ng/mL (2). 1. IOM (Institute of Medicine). 2010. Dietary reference    intakes for calcium and D. [place]: The    [employer]. 2. Holick MF, Binkley [place], Bischoff-Ferrari HA, et al.    Evaluation, treatment, and prevention of vitamin D    deficiency: an Endocrine Society clinical practice    guideline. JCEM. 2011 Jul;  96(7):1911-30.   T3     Status: None   Collection Time: 09/25/18  1:23 PM  Result Value Ref Range   T3, Total 95 71 - 180 ng/dL  Ferritin     Status: Abnormal   Collection Time: 09/25/18  1:23 PM  Result Value Ref Range   Ferritin 6 (L) 15 - 150 ng/mL    Assessment/Plan: 1. Encounter for general adult medical examination with abnormal findings Annual health maintenance exam today  2. Essential hypertension Generally stable. Continue bp medication as prescribed .  3. Gastroesophageal reflux disease without esophagitis Continue Protonix every day and reglan  as needed and as prescribed.   4. Vitamin D deficiency Start drisdol 50000iu weekly.   5. Other fatigue Mild iron deficiency anemia. Encouraged her to take OTC iron supplement every day.   General Counseling: Pearline verbalizes understanding of the findings of todays visit and agrees with plan of treatment. I have discussed any further diagnostic evaluation that may be needed or ordered today. We also reviewed her medications today. she has been encouraged to call the office with any questions or concerns that should arise related to todays visit.    Counseling:  Hypertension Counseling:   The following hypertensive lifestyle modification were recommended and discussed:  1. Limiting alcohol intake to less than 1 oz/day of ethanol:(24 oz of beer or 8 oz of wine or 2 oz of 100-proof  whiskey). 2. Take baby ASA 81 mg daily. 3. Importance of regular aerobic exercise and losing weight. 4. Reduce dietary saturated fat and cholesterol intake for overall cardiovascular health. 5. Maintaining adequate dietary potassium, calcium, and magnesium intake. 6. Regular monitoring of the blood pressure. 7. Reduce sodium intake to less than 100 mmol/day (less than 2.3 gm of sodium or less than 6 gm of sodium choride)   This patient was seen by Vincent Gros FNP Collaboration with Dr Lyndon Code as a part of collaborative care  agreement  Time spent: 32 Minutes      Lyndon Code, MD  Internal Medicine

## 2018-12-03 ENCOUNTER — Other Ambulatory Visit: Payer: Self-pay | Admitting: Internal Medicine

## 2018-12-03 DIAGNOSIS — K219 Gastro-esophageal reflux disease without esophagitis: Secondary | ICD-10-CM

## 2018-12-04 ENCOUNTER — Other Ambulatory Visit: Payer: Self-pay

## 2018-12-04 DIAGNOSIS — I10 Essential (primary) hypertension: Secondary | ICD-10-CM

## 2018-12-04 MED ORDER — BENAZEPRIL HCL 10 MG PO TABS: 10.0000 mg | ORAL_TABLET | Freq: Every day | ORAL | 1 refills | Status: DC

## 2019-04-04 ENCOUNTER — Ambulatory Visit: Payer: Managed Care, Other (non HMO) | Admitting: Nurse Practitioner

## 2019-09-10 ENCOUNTER — Other Ambulatory Visit: Payer: Self-pay

## 2019-09-13 ENCOUNTER — Other Ambulatory Visit: Payer: Self-pay

## 2019-09-13 DIAGNOSIS — K219 Gastro-esophageal reflux disease without esophagitis: Secondary | ICD-10-CM

## 2019-09-13 MED ORDER — METOCLOPRAMIDE HCL 5 MG PO TABS: 5.0000 mg | ORAL_TABLET | Freq: Three times a day (TID) | ORAL | 0 refills | Status: DC | PRN

## 2019-09-13 NOTE — Telephone Encounter (Signed)
Pt advised send reglan for 1 months pt need appt for further refill and she can take OTC vitamin d 2000 Iu

## 2019-09-26 ENCOUNTER — Telehealth: Payer: Self-pay

## 2019-09-26 NOTE — Telephone Encounter (Signed)
LMOM FOR PATIENT TO CONFIRM AND SCREEN FOR 09-30-19 OV. 

## 2019-09-30 ENCOUNTER — Telehealth: Payer: Self-pay

## 2019-09-30 ENCOUNTER — Encounter: Payer: Self-pay | Admitting: Nurse Practitioner

## 2019-09-30 ENCOUNTER — Ambulatory Visit (INDEPENDENT_AMBULATORY_CARE_PROVIDER_SITE_OTHER): Payer: Managed Care, Other (non HMO) | Admitting: Nurse Practitioner

## 2019-09-30 ENCOUNTER — Other Ambulatory Visit: Payer: Self-pay

## 2019-09-30 VITALS — BP 165/105 | HR 69 | Temp 97.3°F | Resp 16 | Ht 67.0 in | Wt 166.8 lb

## 2019-09-30 DIAGNOSIS — K219 Gastro-esophageal reflux disease without esophagitis: Secondary | ICD-10-CM

## 2019-09-30 DIAGNOSIS — F5101 Primary insomnia: Secondary | ICD-10-CM

## 2019-09-30 DIAGNOSIS — I10 Essential (primary) hypertension: Secondary | ICD-10-CM

## 2019-09-30 MED ORDER — TRAZODONE HCL 50 MG PO TABS: ORAL_TABLET | ORAL | 3 refills | Status: DC

## 2019-09-30 MED ORDER — METOCLOPRAMIDE HCL 5 MG PO TABS: 5.0000 mg | ORAL_TABLET | Freq: Three times a day (TID) | ORAL | 3 refills | Status: DC | PRN

## 2019-09-30 MED ORDER — BENAZEPRIL HCL 10 MG PO TABS: 10.0000 mg | ORAL_TABLET | Freq: Every day | ORAL | 1 refills | Status: DC

## 2019-09-30 NOTE — Progress Notes (Signed)
[facility] [place], [place] [address]  Internal MEDICINE  Office Visit Note  Patient Name: Tiffany Campbell  [id number]  [id number]  Date of Service: 10/06/2019  Chief Complaint  Patient presents with  . Hypertension  . Gastroesophageal Reflux  . Insomnia    The patient is here for follow up visit. Blood pressure moderately elevated. Has not been taking blood pressure medication regularly. States that the last time she remembers taking it was Friday. She denies denies chest pain, chest pressure, or shortness of breath. States that she does have persistent headache. She also has been having a great deal of trouble sleeping .she has trouble getting to sleep and staying asleep. At her last visit, she was prescribed trazodone to help. She states that she ran out of the medication a while ago. Had no more refills. Dose she was given helped her to fall asleep, however, she would still wake up in the middle of the night and have trouble getting back to sleep.       Current Medication: Outpatient Encounter Medications as of 09/30/2019  Medication Sig  . benazepril (LOTENSIN) 10 MG tablet Take 1 tablet (10 mg total) by mouth daily.  . ergocalciferol (DRISDOL) 1.25 MG (50000 UT) capsule Take 1 capsule (50,000 Units total) by mouth once a week.  . metoCLOPramide (REGLAN) 5 MG tablet Take 1 tablet (5 mg total) by mouth every 8 (eight) hours as needed for nausea.  . pantoprazole (PROTONIX) 40 MG tablet TAKE 1 TABLET BY MOUTH  DAILY  . traZODone (DESYREL) 50 MG tablet Take 1 to 2 tablets po QHS prn insomnia  . [DISCONTINUED] benazepril (LOTENSIN) 10 MG tablet Take 1 tablet (10 mg total) by mouth daily.  . [DISCONTINUED] metoCLOPramide (REGLAN) 5 MG tablet Take 1 tablet (5 mg total) by mouth every 8 (eight) hours as needed for nausea.  . [DISCONTINUED] traZODone (DESYREL) 50 MG tablet Take 0.5-1 tablets (25-50 mg total) by mouth at bedtime as needed for sleep.  .  [DISCONTINUED] pramoxine-hydrocortisone cream Apply to affected area 2 to 3 times daily as needed. (Patient not taking: Reported on 09/30/2019)   No facility-administered encounter medications on file as of 09/30/2019.    Surgical History: Past Surgical History:  Procedure Laterality Date  . CHOLECYSTECTOMY    . TUBAL LIGATION      Medical History: Past Medical History:  Diagnosis Date  . Hypertension     Family History: Family History  Problem Relation Age of Onset  . Hypertension Mother   . Diabetes Father   . Diabetes Maternal Grandmother   . Diabetes Paternal Grandmother     Social History   Socioeconomic History  . Marital status: Single    Spouse name: Not on file  . Number of children: Not on file  . Years of education: Not on file  . Highest education level: Not on file  Occupational History  . Not on file  Tobacco Use  . Smoking status: Never Smoker  . Smokeless tobacco: Never Used  Substance and Sexual Activity  . Alcohol use: No  . Drug use: No  . Sexual activity: Not on file  Other Topics Concern  . Not on file  Social History Narrative  . Not on file   Social Determinants of Health   Financial Resource Strain:   . Difficulty of Paying Living Expenses:   Food Insecurity:   . Worried About Charity fundraiser in the Last Year:   . Ran  Out of Food in the Last Year:   Transportation Needs:   . Lack of Transportation (Medical):   Marland Kitchen Lack of Transportation (Non-Medical):   Physical Activity:   . Days of Exercise per Week:   . Minutes of Exercise per Session:   Stress:   . Feeling of Stress :   Social Connections:   . Frequency of Communication with Friends and Family:   . Frequency of Social Gatherings with Friends and Family:   . Attends Religious Services:   . Active Member of Clubs or Organizations:   . Attends Banker Meetings:   Marland Kitchen Marital Status:   Intimate Partner Violence:   . Fear of Current or Ex-Partner:   .  Emotionally Abused:   Marland Kitchen Physically Abused:   . Sexually Abused:       Review of Systems  Constitutional: Negative for activity change, chills, fatigue and unexpected weight change.  HENT: Negative for congestion, postnasal drip, rhinorrhea, sneezing and sore throat.   Respiratory: Negative for cough, chest tightness, shortness of breath and wheezing.   Cardiovascular: Negative for chest pain and palpitations.       Elevated blood pressure.   Gastrointestinal: Negative for abdominal pain, constipation, diarrhea, nausea and vomiting.  Endocrine: Negative for cold intolerance, heat intolerance, polydipsia and polyuria.  Musculoskeletal: Negative for arthralgias, back pain, joint swelling and neck pain.  Skin: Negative for rash.  Allergic/Immunologic: Negative for environmental allergies.  Neurological: Positive for headaches. Negative for dizziness, tremors and numbness.  Hematological: Negative for adenopathy. Does not bruise/bleed easily.  Psychiatric/Behavioral: Negative for behavioral problems (Depression), sleep disturbance and suicidal ideas. The patient is not nervous/anxious.    Today's Vitals   09/30/19 1516  BP: (!) 165/105  Pulse: 69  Resp: 16  Temp: (!) 97.3 F (36.3 C)  SpO2: 99%  Weight: 166 lb 12.8 oz (75.7 kg)  Height: 5\' 7"  (1.702 m)   Body mass index is 26.12 kg/m.  Physical Exam Vitals and nursing note reviewed.  Constitutional:      General: She is not in acute distress.    Appearance: Normal appearance. She is well-developed. She is not diaphoretic.  HENT:     Head: Normocephalic and atraumatic.     Nose: Nose normal.     Mouth/Throat:     Pharynx: No oropharyngeal exudate.  Eyes:     Pupils: Pupils are equal, round, and reactive to light.  Neck:     Thyroid: No thyromegaly.     Vascular: No JVD.     Trachea: No tracheal deviation.  Cardiovascular:     Rate and Rhythm: Normal rate and regular rhythm.     Heart sounds: Normal heart sounds. No  murmur. No friction rub. No gallop.   Pulmonary:     Effort: Pulmonary effort is normal. No respiratory distress.     Breath sounds: Normal breath sounds. No wheezing or rales.  Chest:     Chest wall: No tenderness.  Abdominal:     Palpations: Abdomen is soft.  Musculoskeletal:        General: Normal range of motion.     Cervical back: Normal range of motion and neck supple.  Lymphadenopathy:     Cervical: No cervical adenopathy.  Skin:    General: Skin is warm and dry.  Neurological:     Mental Status: She is alert and oriented to person, place, and time.     Cranial Nerves: No cranial nerve deficit.  Psychiatric:  Behavior: Behavior normal.        Thought Content: Thought content normal.        Judgment: Judgment normal.    Assessment/Plan: 1. Essential hypertension Renewed prescription for benazepril 10mg  daily. Encouraged her to take this daily. May be taken with other medications. She should limit salt in diet and increase water intake.  - benazepril (LOTENSIN) 10 MG tablet; Take 1 tablet (10 mg total) by mouth daily.  Dispense: 90 tablet; Refill: 1  2. Gastroesophageal reflux disease without esophagitis May take reglan 5mg  up to three times daily if needed for nausea/GERD symptoms.  - metoCLOPramide (REGLAN) 5 MG tablet; Take 1 tablet (5 mg total) by mouth every 8 (eight) hours as needed for nausea.  Dispense: 90 tablet; Refill: 3  3. Primary insomnia May take tazodone 50mg , may take one to two tablets at bedtime as needed for insomnia.  - traZODone (DESYREL) 50 MG tablet; Take 1 to 2 tablets po QHS prn insomnia  Dispense: 60 tablet; Refill: 3  General Counseling: Avleen verbalizes understanding of the findings of todays visit and agrees with plan of treatment. I have discussed any further diagnostic evaluation that may be needed or ordered today. We also reviewed her medications today. she has been encouraged to call the office with any questions or concerns that  should arise related to todays visit.   Hypertension Counseling:   The following hypertensive lifestyle modification were recommended and discussed:  1. Limiting alcohol intake to less than 1 oz/day of ethanol:(24 oz of beer or 8 oz of wine or 2 oz of 100-proof whiskey). 2. Take baby ASA 81 mg daily. 3. Importance of regular aerobic exercise and losing weight. 4. Reduce dietary saturated fat and cholesterol intake for overall cardiovascular health. 5. Maintaining adequate dietary potassium, calcium, and magnesium intake. 6. Regular monitoring of the blood pressure. 7. Reduce sodium intake to less than 100 mmol/day (less than 2.3 gm of sodium or less than 6 gm of sodium choride)   This patient was seen by FNP Collaboration with Dr as a part of collaborative care agreement  Meds ordered this encounter  Medications  . benazepril (LOTENSIN) 10 MG tablet    Sig: Take 1 tablet (10 mg total) by mouth daily.    Dispense:  90 tablet    Refill:  1    Order Specific Question:   Supervising Provider    Answer:   [[id number]]  . metoCLOPramide (REGLAN) 5 MG tablet    Sig: Take 1 tablet (5 mg total) by mouth every 8 (eight) hours as needed for nausea.    Dispense:  90 tablet    Refill:  3    Order Specific Question:   Supervising Provider    Answer:   Vincent Gros [[id number]]  . traZODone (DESYREL) 50 MG tablet    Sig: Take 1 to 2 tablets po QHS prn insomnia    Dispense:  60 tablet    Refill:  3    Order Specific Question:   Supervising Provider    Answer:   Lyndon Code [[id number]]    Total time spent: 30 Minutes   Time spent includes review of chart, medications, test results, and follow up plan with the patient.      Dr Lyndon Code Internal medicine

## 2019-09-30 NOTE — Telephone Encounter (Signed)
Confirmed appointment on 09/30/2019 and screened for covid. klh °

## 2019-10-23 ENCOUNTER — Other Ambulatory Visit: Payer: Self-pay | Admitting: Nurse Practitioner

## 2019-10-24 LAB — T4, FREE: Free T4: 1.16 ng/dL (ref 0.82–1.77)

## 2019-10-24 LAB — COMPREHENSIVE METABOLIC PANEL
ALT: 7 IU/L (ref 0–32)
AST: 13 IU/L (ref 0–40)
Albumin/Globulin Ratio: 1.3 (ref 1.2–2.2)
Albumin: 4.3 g/dL (ref 3.8–4.8)
Alkaline Phosphatase: 97 IU/L (ref 39–117)
BUN/Creatinine Ratio: 16 (ref 9–23)
BUN: 14 mg/dL (ref 6–20)
Bilirubin Total: 0.3 mg/dL (ref 0.0–1.2)
CO2: 23 mmol/L (ref 20–29)
Calcium: 9.5 mg/dL (ref 8.7–10.2)
Chloride: 103 mmol/L (ref 96–106)
Creatinine, Ser: 0.87 mg/dL (ref 0.57–1.00)
GFR calc Af Amer: 101 mL/min/{1.73_m2} (ref >59)
GFR calc non Af Amer: 87 mL/min/{1.73_m2} (ref >59)
Globulin, Total: 3.2 g/dL (ref 1.5–4.5)
Glucose: 123 mg/dL — ABNORMAL HIGH (ref 65–99)
Potassium: 4.2 mmol/L (ref 3.5–5.2)
Sodium: 140 mmol/L (ref 134–144)
Total Protein: 7.5 g/dL (ref 6.0–8.5)

## 2019-10-24 LAB — CBC
Hematocrit: 35.1 % (ref 34.0–46.6)
Hemoglobin: 11.8 g/dL (ref 11.1–15.9)
MCH: 30.6 pg (ref 26.6–33.0)
MCHC: 33.6 g/dL (ref 31.5–35.7)
MCV: 91 fL (ref 79–97)
Platelets: 369 10*3/uL (ref 150–450)
RBC: 3.86 x10E6/uL (ref 3.77–5.28)
RDW: 15.6 % — ABNORMAL HIGH (ref 11.7–15.4)
WBC: 10.6 10*3/uL (ref 3.4–10.8)

## 2019-10-24 LAB — VITAMIN D 25 HYDROXY (VIT D DEFICIENCY, FRACTURES): Vit D, 25-Hydroxy: 24.4 ng/mL — ABNORMAL LOW (ref 30.0–100.0)

## 2019-10-24 LAB — IRON AND TIBC
Iron Saturation: 8 % — CL (ref 15–55)
Iron: 27 ug/dL (ref 27–159)
Total Iron Binding Capacity: 342 ug/dL (ref 250–450)
UIBC: 315 ug/dL (ref 131–425)

## 2019-10-24 LAB — B12 AND FOLATE PANEL
Folate: 5.1 ng/mL (ref >3.0)
Vitamin B-12: 536 pg/mL (ref 232–1245)

## 2019-10-24 LAB — FERRITIN: Ferritin: 16 ng/mL (ref 15–150)

## 2019-10-24 LAB — TSH: TSH: 0.683 u[IU]/mL (ref 0.450–4.500)

## 2019-10-24 LAB — HGB A1C W/O EAG: Hgb A1c MFr Bld: 5.1 % (ref 4.8–5.6)

## 2019-10-27 NOTE — Progress Notes (Signed)
Discuss lab results with patient at visit 11/11/2019

## 2019-11-07 ENCOUNTER — Telehealth: Payer: Self-pay

## 2019-11-07 NOTE — Telephone Encounter (Signed)
Lmom for patient to confirm and screen for 11-11-19 ov.

## 2019-11-11 ENCOUNTER — Other Ambulatory Visit: Payer: Managed Care, Other (non HMO) | Admitting: Nurse Practitioner

## 2019-12-12 ENCOUNTER — Other Ambulatory Visit: Payer: Managed Care, Other (non HMO) | Admitting: Nurse Practitioner

## 2019-12-26 ENCOUNTER — Telehealth: Payer: Self-pay

## 2019-12-26 NOTE — Telephone Encounter (Signed)
Confirmed and screened for 12-30-19 patient not been vaccinated for Covid.

## 2019-12-30 ENCOUNTER — Encounter: Payer: Self-pay | Admitting: Nurse Practitioner

## 2019-12-30 ENCOUNTER — Ambulatory Visit (INDEPENDENT_AMBULATORY_CARE_PROVIDER_SITE_OTHER): Payer: Managed Care, Other (non HMO) | Admitting: Nurse Practitioner

## 2019-12-30 ENCOUNTER — Other Ambulatory Visit: Payer: Self-pay

## 2019-12-30 VITALS — BP 142/90 | HR 81 | Temp 97.8°F | Resp 16 | Ht 67.0 in | Wt 171.8 lb

## 2019-12-30 DIAGNOSIS — Z0001 Encounter for general adult medical examination with abnormal findings: Secondary | ICD-10-CM | POA: Diagnosis not present

## 2019-12-30 DIAGNOSIS — E559 Vitamin D deficiency, unspecified: Secondary | ICD-10-CM

## 2019-12-30 DIAGNOSIS — Z124 Encounter for screening for malignant neoplasm of cervix: Secondary | ICD-10-CM | POA: Diagnosis not present

## 2019-12-30 DIAGNOSIS — K219 Gastro-esophageal reflux disease without esophagitis: Secondary | ICD-10-CM

## 2019-12-30 DIAGNOSIS — I10 Essential (primary) hypertension: Secondary | ICD-10-CM | POA: Diagnosis not present

## 2019-12-30 DIAGNOSIS — R300 Dysuria: Secondary | ICD-10-CM

## 2019-12-30 DIAGNOSIS — E611 Iron deficiency: Secondary | ICD-10-CM | POA: Diagnosis not present

## 2019-12-30 MED ORDER — FERRALET 90 90-1 MG PO TABS: 1.0000 | ORAL_TABLET | Freq: Every day | ORAL | 5 refills | Status: DC

## 2019-12-30 MED ORDER — ERGOCALCIFEROL 1.25 MG (50000 UT) PO CAPS: 50000.0000 [IU] | ORAL_CAPSULE | ORAL | 5 refills | Status: DC

## 2019-12-30 NOTE — Progress Notes (Signed)
[facility] [address] [place], [place] [address]  Internal MEDICINE  Office Visit Note  Patient Name: Tiffany Campbell  [id number]  [id number]  Date of Service: 01/11/2020   Pt is here for routine health maintenance examination   Chief Complaint  Patient presents with   Annual Exam    Papsmear     The patient is here for health maintenance exam and pap smear. She has moderate fatigue and she has had iron deficiency in the past. Most recent labs were done in 10/2019 which did show moderate anemia. She does take iron supplement, but this has not helped significantly. She has seen hematology in the the past. She has needed to have iron infusions previously. Her labs indicate vitamin d deficiency as well. She states that she is doing well with GERD symptoms. She takes pantoprazole every day. Her blood pressure is generally well controlled. Was a little elevated when she first arrived In the office, but improved throughout the visit.     Current Medication: Outpatient Encounter Medications as of 12/30/2019  Medication Sig   benazepril (LOTENSIN) 10 MG tablet Take 1 tablet (10 mg total) by mouth daily.   ergocalciferol (DRISDOL) 1.25 MG (50000 UT) capsule Take 1 capsule (50,000 Units total) by mouth once a week.   Fe Cbn-Fe Gluc-FA-B12-C-DSS (FERRALET 90) 90-1 MG TABS Take 1 tablet by mouth daily.   metoCLOPramide (REGLAN) 5 MG tablet Take 1 tablet (5 mg total) by mouth every 8 (eight) hours as needed for nausea.   [DISCONTINUED] ergocalciferol (DRISDOL) 1.25 MG (50000 UT) capsule Take 1 capsule (50,000 Units total) by mouth once a week.   [DISCONTINUED] pantoprazole (PROTONIX) 40 MG tablet TAKE 1 TABLET BY MOUTH  DAILY   [DISCONTINUED] traZODone (DESYREL) 50 MG tablet Take 1 to 2 tablets po QHS prn insomnia   No facility-administered encounter medications on file as of 12/30/2019.    Surgical History: Past Surgical History:  Procedure Laterality Date    CHOLECYSTECTOMY     TUBAL LIGATION      Medical History: Past Medical History:  Diagnosis Date   Hypertension     Family History: Family History  Problem Relation Age of Onset   Hypertension Mother    Diabetes Father    Diabetes Maternal Grandmother    Diabetes Paternal Grandmother       Review of Systems  Constitutional: Positive for fatigue. Negative for activity change, chills and unexpected weight change.  HENT: Negative for congestion, postnasal drip, rhinorrhea, sneezing and sore throat.   Respiratory: Negative for cough, chest tightness, shortness of breath and wheezing.   Cardiovascular: Negative for chest pain and palpitations.       Well managed blood pressure.   Gastrointestinal: Negative for abdominal pain, constipation, diarrhea, nausea and vomiting.       Well managed GERD.   Endocrine: Negative for cold intolerance, heat intolerance, polydipsia and polyuria.  Genitourinary: Negative for dysuria, flank pain, frequency, hematuria and urgency.  Musculoskeletal: Negative for arthralgias, back pain, joint swelling and neck pain.  Skin: Negative for rash.  Allergic/Immunologic: Negative for environmental allergies.  Neurological: Positive for headaches. Negative for dizziness, tremors and numbness.  Hematological: Negative for adenopathy. Does not bruise/bleed easily.  Psychiatric/Behavioral: Negative for behavioral problems (Depression), sleep disturbance and suicidal ideas. The patient is not nervous/anxious.      Today's Vitals   12/30/19 1530  BP: (!) 142/90  Pulse: 81  Resp: 16  Temp: 97.8 F (36.6 C)  SpO2: 100%  Weight: 171 lb 12.8 oz (77.9 kg)  Height: 5\' 7"  (1.702 m)   Body mass index is 26.91 kg/m.  Physical Exam Vitals and nursing note reviewed.  Constitutional:      General: She is not in acute distress.    Appearance: Normal appearance. She is well-developed. She is not diaphoretic.  HENT:     Head: Normocephalic and atraumatic.      Nose: Nose normal.     Mouth/Throat:     Pharynx: No oropharyngeal exudate.  Eyes:     Pupils: Pupils are equal, round, and reactive to light.  Neck:     Thyroid: No thyromegaly.     Vascular: No JVD.     Trachea: No tracheal deviation.  Cardiovascular:     Rate and Rhythm: Normal rate and regular rhythm.     Heart sounds: Normal heart sounds. No murmur heard.  No friction rub. No gallop.   Pulmonary:     Effort: Pulmonary effort is normal. No respiratory distress.     Breath sounds: Normal breath sounds. No wheezing or rales.  Chest:     Chest wall: No tenderness.     Breasts:        Right: Normal. No swelling, bleeding, inverted nipple, mass, nipple discharge, skin change or tenderness.        Left: Normal. No swelling, bleeding, inverted nipple, mass, nipple discharge, skin change or tenderness.  Abdominal:     General: Bowel sounds are normal.     Palpations: Abdomen is soft.     Tenderness: There is no abdominal tenderness.     Hernia: There is no hernia in the left inguinal area or right inguinal area.  Genitourinary:    Exam position: Supine.     Labia:        Right: No rash, tenderness or lesion.        Left: No rash, tenderness or lesion.      Vagina: No vaginal discharge, erythema or tenderness.     Cervix: No cervical motion tenderness, discharge, friability, lesion or erythema.     Uterus: Normal.      Adnexa: Left adnexa normal.     Comments: No tenderness, masses, or organomeglay present during bimanual exam . Musculoskeletal:        General: Normal range of motion.     Cervical back: Normal range of motion and neck supple.  Lymphadenopathy:     Cervical: No cervical adenopathy.     Upper Body:     Right upper body: No axillary adenopathy.     Left upper body: No axillary adenopathy.     Lower Body: No right inguinal adenopathy. No left inguinal adenopathy.  Skin:    General: Skin is warm and dry.  Neurological:     General: No focal deficit present.      Mental Status: She is alert and oriented to person, place, and time.     Cranial Nerves: No cranial nerve deficit.  Psychiatric:        Mood and Affect: Mood normal.        Behavior: Behavior normal.        Thought Content: Thought content normal.        Judgment: Judgment normal.     LABS: Recent Results (from the past 2160 hour(s))  Comprehensive metabolic panel     Status: Abnormal   Collection Time: 10/23/19  1:36 PM  Result Value Ref Range   Glucose 123 (H) 65 - 99 mg/dL  BUN 14 6 - 20 mg/dL   Creatinine, Ser 9.38 0.57 - 1.00 mg/dL   GFR calc non Af Amer 87 >59 mL/min/1.73   GFR calc Af Amer 101 >59 mL/min/1.73   BUN/Creatinine Ratio 16 9 - 23   Sodium 140 134 - 144 mmol/L   Potassium 4.2 3.5 - 5.2 mmol/L   Chloride 103 96 - 106 mmol/L   CO2 23 20 - 29 mmol/L   Calcium 9.5 8.7 - 10.2 mg/dL   Total Protein 7.5 6.0 - 8.5 g/dL   Albumin 4.3 3.8 - 4.8 g/dL   Globulin, Total 3.2 1.5 - 4.5 g/dL   Albumin/Globulin Ratio 1.3 1.2 - 2.2   Bilirubin Total 0.3 0.0 - 1.2 mg/dL   Alkaline Phosphatase 97 39 - 117 IU/L   AST 13 0 - 40 IU/L   ALT 7 0 - 32 IU/L  CBC     Status: Abnormal   Collection Time: 10/23/19  1:36 PM  Result Value Ref Range   WBC 10.6 3.4 - 10.8 x10E3/uL   RBC 3.86 3.77 - 5.28 x10E6/uL   Hemoglobin 11.8 11.1 - 15.9 g/dL   Hematocrit 18.2 99.3 - 46.6 %   MCV 91 79 - 97 fL   MCH 30.6 26.6 - 33.0 pg   MCHC 33.6 31 - 35 g/dL   RDW 71.6 (H) 96.7 - 89.3 %   Platelets 369 150 - 450 x10E3/uL  Iron and TIBC     Status: Abnormal   Collection Time: 10/23/19  1:36 PM  Result Value Ref Range   Total Iron Binding Capacity 342 250 - 450 ug/dL   UIBC 810 175 - 102 ug/dL   Iron 27 27 - 585 ug/dL   Iron Saturation 8 (LL) 15 - 55 %  B12 and Folate Panel     Status: None   Collection Time: 10/23/19  1:36 PM  Result Value Ref Range   Vitamin B-12 536 232 - 1,245 pg/mL   Folate 5.1 >3.0 ng/mL    Comment: A serum folate concentration of less than 3.1 ng/mL  is considered to represent clinical deficiency.   Hgb A1c w/o eAG     Status: None   Collection Time: 10/23/19  1:36 PM  Result Value Ref Range   Hgb A1c MFr Bld 5.1 4.8 - 5.6 %    Comment:          Prediabetes: 5.7 - 6.4          Diabetes: >6.4          Glycemic control for adults with diabetes: <7.0   T4, free     Status: None   Collection Time: 10/23/19  1:36 PM  Result Value Ref Range   Free T4 1.16 0.82 - 1.77 ng/dL  TSH     Status: None   Collection Time: 10/23/19  1:36 PM  Result Value Ref Range   TSH 0.683 0.450 - 4.500 uIU/mL  VITAMIN D 25 Hydroxy (Vit-D Deficiency, Fractures)     Status: Abnormal   Collection Time: 10/23/19  1:36 PM  Result Value Ref Range   Vit D, 25-Hydroxy 24.4 (L) 30.0 - 100.0 ng/mL    Comment: Vitamin D deficiency has been defined by the Institute of Medicine and an Endocrine Society practice guideline as a level of serum 25-OH vitamin D less than 20 ng/mL (1,2). The Endocrine Society went on to further define vitamin D insufficiency as a level between 21 and 29 ng/mL (2). 1. IOM (Institute of  Medicine). 2010. Dietary reference    intakes for calcium and D. [place]: The    [employer]. 2. Holick MF, Binkley [place], Bischoff-Ferrari HA, et al.    Evaluation, treatment, and prevention of vitamin D    deficiency: an Endocrine Society clinical practice    guideline. JCEM. 2011 Jul; 96(7):1911-30.   Ferritin     Status: None   Collection Time: 10/23/19  1:36 PM  Result Value Ref Range   Ferritin 16 15.0 - 150.0 ng/mL  IGP, Aptima HPV     Status: None   Collection Time: 12/30/19  3:41 PM  Result Value Ref Range   Interpretation NILM     Comment: NEGATIVE FOR INTRAEPITHELIAL LESION OR MALIGNANCY.   Category NIL     Comment: Negative for Intraepithelial Lesion   Adequacy ENDO     Comment: Satisfactory for evaluation. Endocervical and/or squamous metaplastic cells (endocervical component) are present.    Clinician Provided ICD10  Comment     Comment: Z12.4   Performed by: Comment     Comment: Hope Kandace ParkinsMarie Cox, Cytotechnologist (ASCP)   Note: Comment     Comment: The Pap smear is a screening test designed to aid in the detection of premalignant and malignant conditions of the uterine cervix.  It is not a diagnostic procedure and should not be used as the sole means of detecting cervical cancer.  Both false-positive and false-negative reports do occur.    Test Methodology Comment     Comment: This liquid based ThinPrep(R) pap test was screened with the use of an image guided system.    HPV Aptima Negative Negative    Comment: This nucleic acid amplification test detects fourteen high-risk HPV types (16,18,31,33,35,39,45,51,52,56,58,59,66,68) without differentiation.   Urinalysis, Routine w reflex microscopic     Status: None   Collection Time: 12/30/19  3:42 PM  Result Value Ref Range   Specific Gravity, UA 1.020 1.005 - 1.030   pH, UA 7.0 5.0 - 7.5   Color, UA Yellow Yellow   Appearance Ur Clear Clear   Leukocytes,UA Negative Negative   Protein,UA Negative Negative/Trace   Glucose, UA Negative Negative   Ketones, UA Negative Negative   RBC, UA Negative Negative   Bilirubin, UA Negative Negative   Urobilinogen, Ur 0.2 0.2 - 1.0 mg/dL   Nitrite, UA Negative Negative   Microscopic Examination Comment     Comment: Microscopic not indicated and not performed.    Assessment/Plan: 1. Encounter for general adult medical examination with abnormal findings Annual health maintenance exam with pap smear today.   2. Essential hypertension Stable. Continue bp medication as prescribed   3. Gastroesophageal reflux disease without esophagitis Continue pantoprazole as prescribed.   4. Iron deficiency Restart patient on ferralet every day. Refer to hematology for further evaluation and treatment.  - Fe Cbn-Fe Gluc-FA-B12-C-DSS (FERRALET 90) 90-1 MG TABS; Take 1 tablet by mouth daily.  Dispense: 30 tablet; Refill:  5 - Ambulatory referral to Hematology  5. Vitamin D deficiency Drisdol [id number] iu weekly for next several months.  - ergocalciferol (DRISDOL) 1.25 MG (50000 UT) capsule; Take 1 capsule (50,000 Units total) by mouth once a week.  Dispense: 4 capsule; Refill: 5  6. Routine cervical smear - IGP, Aptima HPV  7. Dysuria - Urinalysis, Routine w reflex microscopic  General Counseling: Kymani verbalizes understanding of the findings of todays visit and agrees with plan of treatment. I have discussed any further diagnostic evaluation that may be needed or ordered today. We  also reviewed her medications today. she has been encouraged to call the office with any questions or concerns that should arise related to todays visit.    Counseling:  Hypertension Counseling:   The following hypertensive lifestyle modification were recommended and discussed:  1. Limiting alcohol intake to less than 1 oz/day of ethanol:(24 oz of beer or 8 oz of wine or 2 oz of 100-proof whiskey). 2. Take baby ASA 81 mg daily. 3. Importance of regular aerobic exercise and losing weight. 4. Reduce dietary saturated fat and cholesterol intake for overall cardiovascular health. 5. Maintaining adequate dietary potassium, calcium, and magnesium intake. 6. Regular monitoring of the blood pressure. 7. Reduce sodium intake to less than 100 mmol/day (less than 2.3 gm of sodium or less than 6 gm of sodium choride)   This patient was seen by Vincent Gros FNP Collaboration with Dr Lyndon Code as a part of collaborative care agreement  Orders Placed This Encounter  Procedures   Urinalysis, Routine w reflex microscopic   Ambulatory referral to Hematology    Meds ordered this encounter  Medications   ergocalciferol (DRISDOL) 1.25 MG (50000 UT) capsule    Sig: Take 1 capsule (50,000 Units total) by mouth once a week.    Dispense:  4 capsule    Refill:  5    Order Specific Question:   Supervising Provider    Answer:   Beverely Risen M [[id number]]   Fe Cbn-Fe Gluc-FA-B12-C-DSS (FERRALET 90) 90-1 MG TABS    Sig: Take 1 tablet by mouth daily.    Dispense:  30 tablet    Refill:  5    Order Specific Question:   Supervising Provider    Answer:   Lyndon Code [[id number]]    Total time spent: 45 Minutes  Time spent includes review of chart, medications, test results, and follow up plan with the patient.     Lyndon Code, MD  Internal Medicine

## 2019-12-31 ENCOUNTER — Other Ambulatory Visit: Payer: Self-pay

## 2019-12-31 DIAGNOSIS — K219 Gastro-esophageal reflux disease without esophagitis: Secondary | ICD-10-CM

## 2019-12-31 DIAGNOSIS — F5101 Primary insomnia: Secondary | ICD-10-CM

## 2019-12-31 LAB — URINALYSIS, ROUTINE W REFLEX MICROSCOPIC
Bilirubin, UA: NEGATIVE
Glucose, UA: NEGATIVE
Ketones, UA: NEGATIVE
Leukocytes,UA: NEGATIVE
Nitrite, UA: NEGATIVE
Protein,UA: NEGATIVE
RBC, UA: NEGATIVE
Specific Gravity, UA: 1.02 (ref 1.005–1.030)
Urobilinogen, Ur: 0.2 mg/dL (ref 0.2–1.0)
pH, UA: 7 (ref 5.0–7.5)

## 2019-12-31 MED ORDER — PANTOPRAZOLE SODIUM 40 MG PO TBEC: 40.0000 mg | DELAYED_RELEASE_TABLET | Freq: Every day | ORAL | 1 refills | Status: DC

## 2019-12-31 MED ORDER — TRAZODONE HCL 50 MG PO TABS: ORAL_TABLET | ORAL | 1 refills | Status: DC

## 2020-01-01 LAB — IGP, APTIMA HPV: HPV Aptima: NEGATIVE

## 2020-01-01 NOTE — Progress Notes (Signed)
Lmom

## 2020-01-01 NOTE — Progress Notes (Signed)
Please let the patient know that her pap smear was normal. Thanks.

## 2020-01-03 ENCOUNTER — Telehealth: Payer: Self-pay

## 2020-01-03 NOTE — Telephone Encounter (Signed)
Pt advised pap smear normal  

## 2020-01-11 DIAGNOSIS — E611 Iron deficiency: Secondary | ICD-10-CM | POA: Insufficient documentation

## 2020-01-11 DIAGNOSIS — Z124 Encounter for screening for malignant neoplasm of cervix: Secondary | ICD-10-CM | POA: Insufficient documentation

## 2020-01-11 DIAGNOSIS — R300 Dysuria: Secondary | ICD-10-CM | POA: Insufficient documentation

## 2020-04-30 ENCOUNTER — Ambulatory Visit: Payer: Managed Care, Other (non HMO) | Admitting: Nurse Practitioner

## 2020-05-20 ENCOUNTER — Other Ambulatory Visit: Payer: Self-pay

## 2020-05-20 DIAGNOSIS — I10 Essential (primary) hypertension: Secondary | ICD-10-CM

## 2020-05-20 MED ORDER — BENAZEPRIL HCL 10 MG PO TABS: 10.0000 mg | ORAL_TABLET | Freq: Every day | ORAL | 0 refills | Status: DC

## 2020-07-21 ENCOUNTER — Other Ambulatory Visit: Payer: Self-pay

## 2020-07-22 ENCOUNTER — Other Ambulatory Visit: Payer: Self-pay

## 2020-07-22 DIAGNOSIS — I10 Essential (primary) hypertension: Secondary | ICD-10-CM

## 2020-07-22 MED ORDER — BENAZEPRIL HCL 10 MG PO TABS: 10.0000 mg | ORAL_TABLET | Freq: Every day | ORAL | 0 refills | Status: DC

## 2020-07-23 ENCOUNTER — Other Ambulatory Visit: Payer: Self-pay

## 2020-07-23 ENCOUNTER — Telehealth: Payer: Self-pay

## 2020-07-23 NOTE — Telephone Encounter (Signed)
LMOM for pt to call back and schedule appt for refill request in 1 month

## 2020-07-31 NOTE — Telephone Encounter (Signed)
LMOM for pt to schedule appt for her refill request (second message)

## 2020-09-18 ENCOUNTER — Other Ambulatory Visit: Payer: Self-pay | Admitting: Nurse Practitioner

## 2020-09-18 DIAGNOSIS — F5101 Primary insomnia: Secondary | ICD-10-CM

## 2020-09-18 DIAGNOSIS — E559 Vitamin D deficiency, unspecified: Secondary | ICD-10-CM

## 2021-02-15 ENCOUNTER — Encounter: Payer: Managed Care, Other (non HMO) | Admitting: Physician Assistant

## 2021-04-05 ENCOUNTER — Encounter: Payer: Self-pay | Admitting: Physician Assistant

## 2021-04-05 ENCOUNTER — Ambulatory Visit (INDEPENDENT_AMBULATORY_CARE_PROVIDER_SITE_OTHER): Payer: Managed Care, Other (non HMO) | Admitting: Physician Assistant

## 2021-04-05 ENCOUNTER — Other Ambulatory Visit: Payer: Self-pay

## 2021-04-05 ENCOUNTER — Other Ambulatory Visit: Payer: Self-pay | Admitting: Physician Assistant

## 2021-04-05 DIAGNOSIS — Z01419 Encounter for gynecological examination (general) (routine) without abnormal findings: Secondary | ICD-10-CM

## 2021-04-05 DIAGNOSIS — Z0001 Encounter for general adult medical examination with abnormal findings: Secondary | ICD-10-CM

## 2021-04-05 DIAGNOSIS — I10 Essential (primary) hypertension: Secondary | ICD-10-CM | POA: Diagnosis not present

## 2021-04-05 DIAGNOSIS — R5383 Other fatigue: Secondary | ICD-10-CM

## 2021-04-05 DIAGNOSIS — G4710 Hypersomnia, unspecified: Secondary | ICD-10-CM

## 2021-04-05 DIAGNOSIS — E611 Iron deficiency: Secondary | ICD-10-CM | POA: Diagnosis not present

## 2021-04-05 DIAGNOSIS — E559 Vitamin D deficiency, unspecified: Secondary | ICD-10-CM

## 2021-04-05 MED ORDER — BENAZEPRIL HCL 10 MG PO TABS
10.0000 mg | ORAL_TABLET | Freq: Every day | ORAL | 0 refills | Status: DC
Start: 2021-04-05 — End: 2021-04-05

## 2021-04-05 NOTE — Progress Notes (Signed)
[facility] [place], [place] [address]  Internal MEDICINE  Office Visit Note  Patient Name: Tiffany Campbell  [id number]  [id number]  Date of Service: 04/07/2021  Chief Complaint  Patient presents with   Annual Exam   Hypertension     HPI Pt is here for routine health maintenance examination -Patient ran out of mediations and did not call for refills. Has headaches occasionally, denies any blurred vision. -Does not sleep well. Can go to sleep but will toss and turn and cant get back to sleep. Goes to bed around 10pm, wakes up 6am, but wakes up before then. Husband tells her she snores, and describes her as breathing strangely with gasping. Also has difficulty focusing and forgets things at times. Feels tired all day. -No exercise. 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 -3 kids: 9,11,12  EPWORTH SLEEPINESS SCALE:  Scale:  (0)= no chance of dozing; (1)= slight chance of dozing; (2)= moderate chance of dozing; (3)= high chance of dozing  Chance  Situtation    Sitting and reading: 3    Watching TV: 1    Sitting Inactive in public: 0    As a passenger in car: 0      Lying down to rest: 3    Sitting and talking: 0    Sitting quielty after lunch: 1    In a car, stopped in traffic: 0   TOTAL SCORE:   8 out of 24    Current Medication: Outpatient Encounter Medications as of 04/05/2021  Medication Sig   ergocalciferol (DRISDOL) 1.25 MG (50000 UT) capsule Take 1 capsule (50,000 Units total) by mouth once a week.   Fe Cbn-Fe Gluc-FA-B12-C-DSS (FERRALET 90) 90-1 MG TABS Take 1 tablet by mouth daily.   metoCLOPramide (REGLAN) 5 MG tablet Take 1 tablet (5 mg total) by mouth every 8 (eight) hours as needed for nausea.   pantoprazole (PROTONIX) 40 MG tablet Take 1 tablet (40 mg total) by mouth daily.   traZODone (DESYREL) 50 MG tablet Take 1 to 2 tablets po QHS prn insomnia   [DISCONTINUED] benazepril (LOTENSIN) 10 MG tablet Take 1 tablet (10 mg  total) by mouth daily.   [DISCONTINUED] benazepril (LOTENSIN) 10 MG tablet Take 1 tablet (10 mg total) by mouth daily.   No facility-administered encounter medications on file as of 04/05/2021.    Surgical History: Past Surgical History:  Procedure Laterality Date   CHOLECYSTECTOMY     TUBAL LIGATION      Medical History: Past Medical History:  Diagnosis Date   Hypertension     Family History: Family History  Problem Relation Age of Onset   Hypertension Mother    Diabetes Father    Diabetes Maternal Grandmother    Diabetes Paternal Grandmother       Review of Systems  Constitutional:  Positive for fatigue. Negative for chills and unexpected weight change.  HENT:  Negative for congestion, postnasal drip, rhinorrhea, sneezing and sore throat.   Eyes:  Negative for redness.  Respiratory:  Negative for cough, chest tightness and shortness of breath.   Cardiovascular:  Negative for chest pain and palpitations.  Gastrointestinal:  Negative for abdominal pain, constipation, diarrhea, nausea and vomiting.  Genitourinary:  Negative for dysuria and frequency.  Musculoskeletal:  Negative for arthralgias, back pain, joint swelling and neck pain.  Skin:  Negative for rash.  Neurological: Negative.  Negative for tremors and numbness.  Hematological:  Negative for adenopathy. Does not bruise/bleed easily.  Psychiatric/Behavioral:  Positive for sleep disturbance. Negative for behavioral problems (Depression) and suicidal ideas. The patient is not nervous/anxious.     Vital Signs: BP (!) 140/98   Pulse 82   Temp 97.8 F (36.6 C)   Resp 16   Ht 5\' 7"  (1.702 m)   Wt 200 lb (90.7 kg)   SpO2 99%   BMI 31.32 kg/m    Physical Exam Vitals and nursing note reviewed.  Constitutional:      General: She is not in acute distress.    Appearance: She is well-developed. She is obese. She is not diaphoretic.  HENT:     Head: Normocephalic and atraumatic.     Right Ear: External ear  normal.     Left Ear: External ear normal.     Nose: Nose normal.     Mouth/Throat:     Pharynx: No oropharyngeal exudate.  Eyes:     General: No scleral icterus.       Right eye: No discharge.        Left eye: No discharge.     Conjunctiva/sclera: Conjunctivae normal.     Pupils: Pupils are equal, round, and reactive to light.  Neck:     Thyroid: No thyromegaly.     Vascular: No JVD.     Trachea: No tracheal deviation.  Cardiovascular:     Rate and Rhythm: Normal rate and regular rhythm.     Heart sounds: Normal heart sounds. No murmur heard.   No friction rub. No gallop.  Pulmonary:     Effort: Pulmonary effort is normal. No respiratory distress.     Breath sounds: Normal breath sounds. No stridor. No wheezing or rales.  Chest:     Chest wall: No tenderness.  Breasts:    Right: No mass.     Left: Skin change present. No mass.     Comments: A little dry patch above nipple on left breast that was itchy, but pt states is improving with moisturizer. Will monitor very closely. Abdominal:     General: Bowel sounds are normal. There is no distension.     Palpations: Abdomen is soft. There is no mass.     Tenderness: There is no abdominal tenderness. There is no guarding or rebound.  Musculoskeletal:        General: No tenderness or deformity. Normal range of motion.     Cervical back: Normal range of motion and neck supple.  Lymphadenopathy:     Cervical: No cervical adenopathy.  Skin:    General: Skin is warm and dry.     Coloration: Skin is not pale.     Findings: No erythema or rash.  Neurological:     Mental Status: She is alert.     Cranial Nerves: No cranial nerve deficit.     Motor: No abnormal muscle tone.     Coordination: Coordination normal.     Deep Tendon Reflexes: Reflexes are normal and symmetric.  Psychiatric:        Behavior: Behavior normal.        Thought Content: Thought content normal.        Judgment: Judgment normal.     LABS: Recent Results  (from the past 2160 hour(s))  CBC w/Diff/Platelet     Status: Abnormal   Collection Time: 04/05/21  9:47 AM  Result Value Ref Range   WBC 9.8 3.4 - 10.8 x10E3/uL   RBC 3.92 3.77 - 5.28 x10E6/uL   Hemoglobin 12.5 11.1 - 15.9 g/dL  Hematocrit 36.5 34.0 - 46.6 %   MCV 93 79 - 97 fL   MCH 31.9 26.6 - 33.0 pg   MCHC 34.2 31.5 - 35.7 g/dL   RDW 12.3 11.7 - 15.4 %   Platelets 277 150 - 450 x10E3/uL   Neutrophils 79 Not Estab. %   Lymphs 11 Not Estab. %   Monocytes 8 Not Estab. %   Eos 0 Not Estab. %   Basos 1 Not Estab. %   Neutrophils Absolute 7.7 (H) 1.4 - 7.0 x10E3/uL   Lymphocytes Absolute 1.1 0.7 - 3.1 x10E3/uL   Monocytes Absolute 0.8 0.1 - 0.9 x10E3/uL   EOS (ABSOLUTE) 0.0 0.0 - 0.4 x10E3/uL   Basophils Absolute 0.1 0.0 - 0.2 x10E3/uL   Immature Granulocytes 1 Not Estab. %   Immature Grans (Abs) 0.1 0.0 - 0.1 x10E3/uL  Comprehensive metabolic panel     Status: Abnormal   Collection Time: 04/05/21  9:47 AM  Result Value Ref Range   Glucose 125 (H) 65 - 99 mg/dL    Comment:                **Effective April 12, 2021 Glucose reference**                  interval will be changing to:                                                             70 - 99    BUN 11 6 - 20 mg/dL   Creatinine, Ser 1.12 (H) 0.57 - 1.00 mg/dL   eGFR 65 >59 mL/min/1.73   BUN/Creatinine Ratio 10 9 - 23   Sodium 141 134 - 144 mmol/L   Potassium 4.1 3.5 - 5.2 mmol/L   Chloride 102 96 - 106 mmol/L   CO2 20 20 - 29 mmol/L   Calcium 9.1 8.7 - 10.2 mg/dL   Total Protein 7.3 6.0 - 8.5 g/dL   Albumin 4.4 3.8 - 4.8 g/dL   Globulin, Total 2.9 1.5 - 4.5 g/dL   Albumin/Globulin Ratio 1.5 1.2 - 2.2   Bilirubin Total 0.4 0.0 - 1.2 mg/dL   Alkaline Phosphatase 70 44 - 121 IU/L   AST 13 0 - 40 IU/L   ALT 6 0 - 32 IU/L  TSH + free T4     Status: None   Collection Time: 04/05/21  9:47 AM  Result Value Ref Range   TSH 1.610 0.450 - 4.500 uIU/mL   Free T4 1.25 0.82 - 1.77 ng/dL  Lipid Panel With LDL/HDL Ratio      Status: Abnormal   Collection Time: 04/05/21  9:47 AM  Result Value Ref Range   Cholesterol, Total 201 (H) 100 - 199 mg/dL   Triglycerides 119 0 - 149 mg/dL   HDL 49 >39 mg/dL   VLDL Cholesterol Cal 21 5 - 40 mg/dL   LDL Chol Calc (NIH) 131 (H) 0 - 99 mg/dL   LDL/HDL Ratio 2.7 0.0 - 3.2 ratio    Comment:                                     LDL/HDL Ratio  Men  Women                               1/2 Avg.Risk  1.0    1.5                                   Avg.Risk  3.6    3.2                                2X Avg.Risk  6.2    5.0                                3X Avg.Risk  8.0    6.1   VITAMIN D 25 Hydroxy (Vit-D Deficiency, Fractures)     Status: Abnormal   Collection Time: 04/05/21  9:47 AM  Result Value Ref Range   Vit D, 25-Hydroxy 25.7 (L) 30.0 - 100.0 ng/mL    Comment: Vitamin D deficiency has been defined by the [place] practice guideline as a level of serum 25-OH vitamin D less than 20 ng/mL (1,2). The Endocrine Society went on to further define vitamin D insufficiency as a level between 21 and 29 ng/mL (2). 1. IOM (Institute of Medicine). 2010. Dietary reference    intakes for calcium and D. [place]: The    [employer]. 2. Holick MF, Binkley Omro, Bischoff-Ferrari HA, et al.    Evaluation, treatment, and prevention of vitamin D    deficiency: an Endocrine Society clinical practice    guideline. JCEM. 2011 Jul; 96(7):1911-30.   Iron, TIBC and Ferritin Panel     Status: Abnormal   Collection Time: 04/05/21  9:47 AM  Result Value Ref Range   Total Iron Binding Capacity 389 250 - 450 ug/dL   UIBC 338 131 - 425 ug/dL   Iron 51 27 - 159 ug/dL   Iron Saturation 13 (L) 15 - 55 %   Ferritin 17 15 - 150 ng/mL        Assessment/Plan: 1. Encounter for general adult medical examination with abnormal findings CPE performed, routine labs ordered  2. Essential  hypertension Elevated in office and patient has been without medications, refill sent  3. Hypersomnia Based on snoring, witnessed gasping and apneas, excessive daytime fatigue and uncontrolled blood pressure we will order PSG for evaluation of OSA - PSG SLEEP STUDY  4. Iron deficiency History of iron deficiency, will update labs and restart supplement as needed - CBC w/Diff/Platelet - Iron, TIBC and Ferritin Panel  5. Vitamin D deficiency - VITAMIN D 25 Hydroxy (Vit-D Deficiency, Fractures)  6. Other fatigue - CBC w/Diff/Platelet - Comprehensive metabolic panel - TSH + free T4 - Lipid Panel With LDL/HDL Ratio  7. Visit for Gyn Exam Breast exam performed in office with slight dry patch above left nipple that seems to be improving. Patient does have a family history of breast cancer.  Patient will call office if any further changes or nonresolution of skin changes of left breast or any new findings as we will not hesitate to move forward with mammogram/ultrasound prior to routine screening age   General Counseling: Vela verbalizes understanding of the findings of todays visit and agrees with plan of treatment.  I have discussed any further diagnostic evaluation that may be needed or ordered today. We also reviewed her medications today. she has been encouraged to call the office with any questions or concerns that should arise related to todays visit.    Counseling:    Orders Placed This Encounter  Procedures   CBC w/Diff/Platelet   Comprehensive metabolic panel   TSH + free T4   Lipid Panel With LDL/HDL Ratio   VITAMIN D 25 Hydroxy (Vit-D Deficiency, Fractures)   Iron, TIBC and Ferritin Panel   PSG SLEEP STUDY    Meds ordered this encounter  Medications   DISCONTD: benazepril (LOTENSIN) 10 MG tablet    Sig: Take 1 tablet (10 mg total) by mouth daily.    Dispense:  30 tablet    Refill:  0    Pt need appt for refills    This patient was seen by Drema Dallas,  PA-C in collaboration with Dr. Clayborn Bigness as a part of collaborative care agreement.  Total time spent:40 Minutes  Time spent includes review of chart, medications, test results, and follow up plan with the patient.     Lavera Guise, MD  Internal Medicine

## 2021-04-06 ENCOUNTER — Telehealth: Payer: Self-pay

## 2021-04-06 LAB — COMPREHENSIVE METABOLIC PANEL
ALT: 6 IU/L (ref 0–32)
AST: 13 IU/L (ref 0–40)
Albumin/Globulin Ratio: 1.5 (ref 1.2–2.2)
Albumin: 4.4 g/dL (ref 3.8–4.8)
Alkaline Phosphatase: 70 IU/L (ref 44–121)
BUN/Creatinine Ratio: 10 (ref 9–23)
BUN: 11 mg/dL (ref 6–20)
Bilirubin Total: 0.4 mg/dL (ref 0.0–1.2)
CO2: 20 mmol/L (ref 20–29)
Calcium: 9.1 mg/dL (ref 8.7–10.2)
Chloride: 102 mmol/L (ref 96–106)
Creatinine, Ser: 1.12 mg/dL — ABNORMAL HIGH (ref 0.57–1.00)
Globulin, Total: 2.9 g/dL (ref 1.5–4.5)
Glucose: 125 mg/dL — ABNORMAL HIGH (ref 65–99)
Potassium: 4.1 mmol/L (ref 3.5–5.2)
Sodium: 141 mmol/L (ref 134–144)
Total Protein: 7.3 g/dL (ref 6.0–8.5)
eGFR: 65 mL/min/{1.73_m2} (ref >59)

## 2021-04-06 LAB — CBC WITH DIFFERENTIAL/PLATELET
Basophils Absolute: 0.1 10*3/uL (ref 0.0–0.2)
Basos: 1 %
EOS (ABSOLUTE): 0 10*3/uL (ref 0.0–0.4)
Eos: 0 %
Hematocrit: 36.5 % (ref 34.0–46.6)
Hemoglobin: 12.5 g/dL (ref 11.1–15.9)
Immature Grans (Abs): 0.1 10*3/uL (ref 0.0–0.1)
Immature Granulocytes: 1 %
Lymphocytes Absolute: 1.1 10*3/uL (ref 0.7–3.1)
Lymphs: 11 %
MCH: 31.9 pg (ref 26.6–33.0)
MCHC: 34.2 g/dL (ref 31.5–35.7)
MCV: 93 fL (ref 79–97)
Monocytes Absolute: 0.8 10*3/uL (ref 0.1–0.9)
Monocytes: 8 %
Neutrophils Absolute: 7.7 10*3/uL — ABNORMAL HIGH (ref 1.4–7.0)
Neutrophils: 79 %
Platelets: 277 10*3/uL (ref 150–450)
RBC: 3.92 x10E6/uL (ref 3.77–5.28)
RDW: 12.3 % (ref 11.7–15.4)
WBC: 9.8 10*3/uL (ref 3.4–10.8)

## 2021-04-06 LAB — IRON,TIBC AND FERRITIN PANEL
Ferritin: 17 ng/mL (ref 15–150)
Iron Saturation: 13 % — ABNORMAL LOW (ref 15–55)
Iron: 51 ug/dL (ref 27–159)
Total Iron Binding Capacity: 389 ug/dL (ref 250–450)
UIBC: 338 ug/dL (ref 131–425)

## 2021-04-06 LAB — LIPID PANEL WITH LDL/HDL RATIO
Cholesterol, Total: 201 mg/dL — ABNORMAL HIGH (ref 100–199)
HDL: 49 mg/dL (ref >39)
LDL Chol Calc (NIH): 131 mg/dL — ABNORMAL HIGH (ref 0–99)
LDL/HDL Ratio: 2.7 ratio (ref 0.0–3.2)
Triglycerides: 119 mg/dL (ref 0–149)
VLDL Cholesterol Cal: 21 mg/dL (ref 5–40)

## 2021-04-06 LAB — VITAMIN D 25 HYDROXY (VIT D DEFICIENCY, FRACTURES): Vit D, 25-Hydroxy: 25.7 ng/mL — ABNORMAL LOW (ref 30.0–100.0)

## 2021-04-06 LAB — TSH+FREE T4
Free T4: 1.25 ng/dL (ref 0.82–1.77)
TSH: 1.61 u[IU]/mL (ref 0.450–4.500)

## 2021-04-06 NOTE — Telephone Encounter (Signed)
LMOM for pt to return call to discuss lab results 

## 2021-04-06 NOTE — Telephone Encounter (Signed)
-----   Message from Carlean Jews, PA-C sent at 04/06/2021  2:56 PM EDT ----- Please let patient know lab results showed Elevated glucose and lipids--will monitor and discuss further next visit. Patient should work on limiting fried/fatty foods to reduce cholesterol. Low vit D--will need to supplement OTC. Iron is slightly low and should restart iron supplement--may take OTC.

## 2021-04-07 ENCOUNTER — Telehealth: Payer: Self-pay

## 2021-04-07 NOTE — Telephone Encounter (Signed)
Informed pt of labs.  

## 2021-05-10 ENCOUNTER — Ambulatory Visit: Payer: Managed Care, Other (non HMO) | Admitting: Physician Assistant

## 2021-05-11 ENCOUNTER — Telehealth: Payer: Self-pay

## 2021-05-11 NOTE — Telephone Encounter (Signed)
Feeling great was unable to schedule patient for a sleep study because the patient refused a PSG. Patient sent an email to [facility] stating that they do not want to complete a PSG.

## 2021-05-27 ENCOUNTER — Ambulatory Visit: Payer: Managed Care, Other (non HMO) | Admitting: Physician Assistant

## 2021-11-09 ENCOUNTER — Encounter: Payer: Self-pay | Admitting: Nurse Practitioner

## 2021-11-09 ENCOUNTER — Other Ambulatory Visit: Payer: Self-pay | Admitting: Nurse Practitioner

## 2021-11-09 ENCOUNTER — Ambulatory Visit: Payer: Managed Care, Other (non HMO) | Admitting: Nurse Practitioner

## 2021-11-09 ENCOUNTER — Telehealth: Payer: Self-pay

## 2021-11-09 VITALS — BP 190/90 | HR 70 | Temp 98.5°F | Resp 16 | Ht 67.0 in | Wt 186.8 lb

## 2021-11-09 DIAGNOSIS — R040 Epistaxis: Secondary | ICD-10-CM

## 2021-11-09 DIAGNOSIS — N911 Secondary amenorrhea: Secondary | ICD-10-CM

## 2021-11-09 DIAGNOSIS — K219 Gastro-esophageal reflux disease without esophagitis: Secondary | ICD-10-CM | POA: Diagnosis not present

## 2021-11-09 DIAGNOSIS — M542 Cervicalgia: Secondary | ICD-10-CM

## 2021-11-09 DIAGNOSIS — I10 Essential (primary) hypertension: Secondary | ICD-10-CM

## 2021-11-09 LAB — POCT GLYCOSYLATED HEMOGLOBIN (HGB A1C): Hemoglobin A1C: 5.2 % (ref 4.0–5.6)

## 2021-11-09 LAB — POCT URINE PREGNANCY: Preg Test, Ur: NEGATIVE

## 2021-11-09 MED ORDER — METHOCARBAMOL 500 MG PO TABS: 500.0000 mg | ORAL_TABLET | Freq: Three times a day (TID) | ORAL | 1 refills | Status: DC | PRN

## 2021-11-09 MED ORDER — PANTOPRAZOLE SODIUM 40 MG PO TBEC: 40.0000 mg | DELAYED_RELEASE_TABLET | Freq: Every day | ORAL | 1 refills | Status: DC

## 2021-11-09 MED ORDER — AMLODIPINE BESYLATE 5 MG PO TABS: 5.0000 mg | ORAL_TABLET | Freq: Every day | ORAL | 0 refills | Status: DC

## 2021-11-09 NOTE — Telephone Encounter (Signed)
Awaiting 11/09/21 office notes for Otolaryngology referral-Toni ?

## 2021-11-09 NOTE — Progress Notes (Signed)
[facility] [address] [place], [place] [address]  Internal MEDICINE  Office Visit Note  Patient Name: Tiffany Campbell  [id number]  [id number]  Date of Service: 11/09/2021  Chief Complaint  Patient presents with   Acute Visit    nose bleeds, no cycle since January, left side of neck hurts down to shoulder and up into her head for the last 3 days      HPI Tiffany Campbell presents for an acute sick visit for several different problems.  Patient has been having significant nosebleeds 1 to 2/day every couple of weeks.  She also has not had a menstrual period in past 3 months.  And the left side of her neck hurts from the back of her head to her left shoulder.  Her blood pressure is also significantly high. -- Epistaxis--- started a couple months ago, frequency is every couple of weeks she will a couple of episodes in 1 day.  The onset of the nosebleed is spontaneous, and she reports that it lasts for 30 to 45 minutes and describes it as pouring out.  She reports that it only happens out of her right nare.  She does not take any allergy medications, denies using nasal sprays, is not blowing her nose when it happens.  It initially starts as pouring out and eventually she will feel like her nostril is stopped up and she will blow her nose and she will blow clots out. --- Hypertension, uncontrolled-----patient has been previously treated for high blood pressure and is prescribed benazepril 10 mg daily currently.  She has been on an additional medication in the past which was triamterene-hydrochlorothiazide but this was discontinued prior to today's visit.  She also reports that she has not been consistent with taking her medication as prescribed and she may take it every other day or forget a few days.  Her blood pressure was still severely elevated when rechecked, see vitals. --- Secondary amenorrhea--- patient reports having no menstrual period Since January of this year, which is approximately 3  months with no cycle.  She reports that she has been sexually active recently, she does not use protection or contraception.  She has previously had a tubal ligation.  She denies any signs or symptoms of any sexually transmitted infections, denies any vaginal discharge.  She is not having any pelvic or abdominal pain.  She has lost 10 pounds in the past week and is not trying.  She does report having hot flashes and some night sweats.  She reports having decreased appetite, low energy and fatigue.  She denies having any hair loss or hirsutism.  She also reports increased thirst but has not had any significant polyuria. --- Left side of neck hurts, is sore and achy from the back of her head on the left side down her left neck and radiating to the left shoulder.  She reports it feels sore like a pulled muscle or like she slept on it wrong.  She denies any upper extremity weakness or loss of grip strength.  She denies any decreased range of motion.    Current Medication:  Outpatient Encounter Medications as of 11/09/2021  Medication Sig   methocarbamol (ROBAXIN) 500 MG tablet Take 1 tablet (500 mg total) by mouth every 8 (eight) hours as needed for muscle spasms.   metoCLOPramide (REGLAN) 5 MG tablet Take 1 tablet (5 mg total) by mouth every 8 (eight) hours as needed for nausea.   [DISCONTINUED] amLODipine (NORVASC) 5 MG tablet  Take 1 tablet (5 mg total) by mouth daily.   [DISCONTINUED] benazepril (LOTENSIN) 10 MG tablet TAKE 1 TABLET(10 MG) BY MOUTH DAILY   [DISCONTINUED] ergocalciferol (DRISDOL) 1.25 MG (50000 UT) capsule Take 1 capsule (50,000 Units total) by mouth once a week.   [DISCONTINUED] Fe Cbn-Fe Gluc-FA-B12-C-DSS (FERRALET 90) 90-1 MG TABS Take 1 tablet by mouth daily.   [DISCONTINUED] pantoprazole (PROTONIX) 40 MG tablet Take 1 tablet (40 mg total) by mouth daily.   [DISCONTINUED] traZODone (DESYREL) 50 MG tablet Take 1 to 2 tablets po QHS prn insomnia   pantoprazole (PROTONIX) 40 MG tablet  Take 1 tablet (40 mg total) by mouth daily.   No facility-administered encounter medications on file as of 11/09/2021.      Medical History: Past Medical History:  Diagnosis Date   Hypertension      Vital Signs: BP (!) 190/90 Comment: 186/109  Pulse 70   Temp 98.5 F (36.9 C)   Resp 16   Ht 5\' 7"  (1.702 m)   Wt 186 lb 12.8 oz (84.7 kg)   SpO2 99%   BMI 29.26 kg/m    Review of Systems  Constitutional:  Positive for appetite change (Decreased), fatigue and unexpected weight change (10 pounds of weight loss in the past week without trying).  HENT:  Positive for congestion and nosebleeds. Negative for postnasal drip, rhinorrhea, sinus pressure, sinus pain, sneezing and sore throat.   Respiratory:  Negative for cough, chest tightness, shortness of breath and wheezing.   Cardiovascular:  Negative for chest pain and palpitations.  Gastrointestinal:  Negative for abdominal pain, constipation, diarrhea, nausea and vomiting.  Endocrine: Positive for polydipsia. Negative for polyphagia and polyuria.  Genitourinary:  Positive for menstrual problem. Negative for decreased urine volume, dysuria, enuresis, flank pain, frequency, pelvic pain, urgency, vaginal bleeding, vaginal discharge and vaginal pain.  Musculoskeletal: Negative.   Neurological:  Positive for headaches. Negative for dizziness and light-headedness.  Psychiatric/Behavioral: Negative.     Physical Exam Vitals reviewed.  Constitutional:      General: She is not in acute distress.    Appearance: Normal appearance. She is obese. She is not ill-appearing.  HENT:     Head: Normocephalic and atraumatic.     Nose: Nose normal.  Eyes:     Pupils: Pupils are equal, round, and reactive to light.  Pulmonary:     Effort: Pulmonary effort is normal. No respiratory distress.  Neurological:     Mental Status: She is alert and oriented to person, place, and time.  Psychiatric:        Mood and Affect: Mood normal.         Behavior: Behavior normal.      Assessment/Plan: 1. Amenorrhea, secondary Labs ordered to further evaluate possible cause. Urine pregnancy test as negative. Labs ordered to rule out thyroid imbalance or other hormone imbalance. A1C is normal.  - FSH/LH - Estradiol - Prolactin - TSH + free T4 - Testosterone,Free and Total - POCT glycosylated hemoglobin (Hb A1C) - Beta HCG, Quant - POCT Pregnancy, Urine - POCT urine pregnancy  2. Right-sided epistaxis Has happened a few times, unsure of cause, CBC to evaluate for anemia, ENT referral for further evaluation.  - Ambulatory referral to ENT - CBC with Differential/Platelet  3. Neck pain on left side  Headache and neck pain may be a product of elevated BP but unable to rule out possible pinched nerve or muscle strain, methocarbamol prescribed for relief of musculoskeletal pain and muscle spasms.  -  methocarbamol (ROBAXIN) 500 MG tablet; Take 1 tablet (500 mg total) by mouth every 8 (eight) hours as needed for muscle spasms.  Dispense: 60 tablet; Refill: 1  4. Gastroesophageal reflux disease without esophagitis Continue pantoprazole as prescribed, currently stable.  - pantoprazole (PROTONIX) 40 MG tablet; Take 1 tablet (40 mg total) by mouth daily.  Dispense: 90 tablet; Refill: 1  5. Essential hypertension Start amlodipine 5 mg daily and continue HCTZ 12.5 mg daily. Follow up in 3 weeks to reevaluate BP - amLODipine (NORVASC) 5 MG tablet; Take 1 tablet (5 mg total) by mouth daily.  Dispense: 90 tablet; Refill: 0   General Counseling: Tiffany Campbell verbalizes understanding of the findings of todays visit and agrees with plan of treatment. I have discussed any further diagnostic evaluation that may be needed or ordered today. We also reviewed her medications today. she has been encouraged to call the office with any questions or concerns that should arise related to todays visit.    Counseling:    Orders Placed This Encounter  Procedures    FSH/LH   Estradiol   Prolactin   TSH + free T4   Testosterone,Free and Total   Beta HCG, Quant   CBC with Differential/Platelet   Ambulatory referral to ENT   POCT glycosylated hemoglobin (Hb A1C)   POCT urine pregnancy    Meds ordered this encounter  Medications   methocarbamol (ROBAXIN) 500 MG tablet    Sig: Take 1 tablet (500 mg total) by mouth every 8 (eight) hours as needed for muscle spasms.    Dispense:  60 tablet    Refill:  1   pantoprazole (PROTONIX) 40 MG tablet    Sig: Take 1 tablet (40 mg total) by mouth daily.    Dispense:  90 tablet    Refill:  1   DISCONTD: amLODipine (NORVASC) 5 MG tablet    Sig: Take 1 tablet (5 mg total) by mouth daily.    Dispense:  90 tablet    Refill:  0    Return in about 3 weeks (around 11/30/2021) for F/U, BP check, Norrin Shreffler PCP.  [place] Controlled Substance Database was reviewed by me for overdose risk score (ORS)  Time spent:30 Minutes Time spent with patient included reviewing progress notes, labs, imaging studies, and discussing plan for follow up.   This patient was seen by Sallyanne Kuster, FNP-C in collaboration with Dr. Beverely Risen as a part of collaborative care agreement.  Camyra Vaeth R. Tedd Sias, MSN, FNP-C Internal Medicine

## 2021-11-10 LAB — FSH/LH
FSH: 6.7 m[IU]/mL
LH: 3.4 m[IU]/mL

## 2021-11-10 LAB — ESTRADIOL: Estradiol: 133 pg/mL

## 2021-11-10 LAB — CBC WITH DIFFERENTIAL/PLATELET
Basophils Absolute: 0.1 10*3/uL (ref 0.0–0.2)
Basos: 1 %
EOS (ABSOLUTE): 0.1 10*3/uL (ref 0.0–0.4)
Eos: 1 %
Hematocrit: 34.8 % (ref 34.0–46.6)
Hemoglobin: 12 g/dL (ref 11.1–15.9)
Immature Grans (Abs): 0 10*3/uL (ref 0.0–0.1)
Immature Granulocytes: 0 %
Lymphocytes Absolute: 1.8 10*3/uL (ref 0.7–3.1)
Lymphs: 22 %
MCH: 30.4 pg (ref 26.6–33.0)
MCHC: 34.5 g/dL (ref 31.5–35.7)
MCV: 88 fL (ref 79–97)
Monocytes Absolute: 0.8 10*3/uL (ref 0.1–0.9)
Monocytes: 10 %
Neutrophils Absolute: 5.5 10*3/uL (ref 1.4–7.0)
Neutrophils: 66 %
Platelets: 277 10*3/uL (ref 150–450)
RBC: 3.95 x10E6/uL (ref 3.77–5.28)
RDW: 15.8 % — ABNORMAL HIGH (ref 11.7–15.4)
WBC: 8.3 10*3/uL (ref 3.4–10.8)

## 2021-11-10 LAB — TSH+FREE T4
Free T4: 1.33 ng/dL (ref 0.82–1.77)
TSH: 0.545 u[IU]/mL (ref 0.450–4.500)

## 2021-11-10 LAB — BETA HCG QUANT (REF LAB): hCG Quant: <1 m[IU]/mL

## 2021-11-10 LAB — PROLACTIN: Prolactin: 11.5 ng/mL (ref 4.8–23.3)

## 2021-11-10 LAB — TESTOSTERONE,FREE AND TOTAL
Testosterone, Free: 2.3 pg/mL (ref 0.0–4.2)
Testosterone: 36 ng/dL (ref 8–60)

## 2021-11-23 NOTE — Telephone Encounter (Signed)
Otolaryngology referral sent via [facility] to [facility]-Toni 

## 2021-11-23 NOTE — Telephone Encounter (Signed)
Appointment 12/23/21 @ 9:00-Toni ?

## 2021-11-30 ENCOUNTER — Encounter: Payer: Self-pay | Admitting: Nurse Practitioner

## 2021-11-30 ENCOUNTER — Ambulatory Visit: Payer: Managed Care, Other (non HMO) | Admitting: Nurse Practitioner

## 2021-11-30 VITALS — BP 149/87 | HR 64 | Temp 98.0°F | Resp 16 | Ht 67.0 in | Wt 186.0 lb

## 2021-11-30 DIAGNOSIS — F5101 Primary insomnia: Secondary | ICD-10-CM

## 2021-11-30 DIAGNOSIS — Z23 Encounter for immunization: Secondary | ICD-10-CM | POA: Diagnosis not present

## 2021-11-30 DIAGNOSIS — I10 Essential (primary) hypertension: Secondary | ICD-10-CM

## 2021-11-30 MED ORDER — TETANUS-DIPHTH-ACELL PERTUSSIS 5-2.5-18.5 LF-MCG/0.5 IM SUSP: 0.5000 mL | Freq: Once | INTRAMUSCULAR | 0 refills | Status: AC

## 2021-11-30 MED ORDER — TRAZODONE HCL 100 MG PO TABS: 100.0000 mg | ORAL_TABLET | Freq: Every day | ORAL | 2 refills | Status: DC

## 2021-11-30 MED ORDER — AMLODIPINE BESYLATE 5 MG PO TABS
5.0000 mg | ORAL_TABLET | Freq: Every day | ORAL | 1 refills | Status: DC
Start: 2021-11-30 — End: 2022-03-09

## 2021-11-30 MED ORDER — HYDROCHLOROTHIAZIDE 12.5 MG PO TABS: 12.5000 mg | ORAL_TABLET | Freq: Every day | ORAL | 3 refills | Status: DC

## 2021-11-30 NOTE — Progress Notes (Signed)
[facility] [address] [place], [place] [address]  Internal MEDICINE  Office Visit Note  Patient Name: Tiffany Campbell  [id number]  [id number]  Date of Service: 11/30/2021  Chief Complaint  Patient presents with   Follow-up   Hypertension    HPI Tiffany Campbell presents for follow-up visit for uncontrolled hypertension and secondary amenorrhea.  She was also having some neck and head pain/headaches at her previous office visit.  At the previous office visit, her blood pressure was significantly elevated as high as 190/90.  She was started on amlodipine 5 mg daily and her blood pressure is significantly improved to 149/87 today.  Patient does report that she has slight swelling in her lower extremities and this is also prior to starting amlodipine and she was previously on a diuretic for that.  Patient reports that she was previously prescribed trazodone to help her sleep and would like to start taking this again because she is having difficulty sleeping.  She also reports that her cycle started after her last office visit and has been off and on over the past few weeks.  Patient does report that her mother went into menopause in her 30s so it is possible that her symptoms are related to perimenopause.  Patient is also due for her tetanus vaccine and needs refill of pantoprazole.  Patient has also not had any nosebleeds since her previous office visit and does have an upcoming appointment with ENT.  Cycle started after last visit been off and on since then.     Current Medication: Outpatient Encounter Medications as of 11/30/2021  Medication Sig   hydrochlorothiazide (HYDRODIURIL) 12.5 MG tablet Take 1 tablet (12.5 mg total) by mouth daily.   methocarbamol (ROBAXIN) 500 MG tablet Take 1 tablet (500 mg total) by mouth every 8 (eight) hours as needed for muscle spasms.   metoCLOPramide (REGLAN) 5 MG tablet Take 1 tablet (5 mg total) by mouth every 8 (eight) hours as needed for nausea.    pantoprazole (PROTONIX) 40 MG tablet Take 1 tablet (40 mg total) by mouth daily.   traZODone (DESYREL) 100 MG tablet Take 1-2 tablets (100-200 mg total) by mouth at bedtime.   [DISCONTINUED] amLODipine (NORVASC) 5 MG tablet Take 1 tablet (5 mg total) by mouth daily.   [DISCONTINUED] Tdap (BOOSTRIX) 5-2.5-18.5 LF-MCG/0.5 injection Inject 0.5 mLs into the muscle once.   [DISCONTINUED] traZODone (DESYREL) 50 MG tablet Take 1 to 2 tablets po QHS prn insomnia   amLODipine (NORVASC) 5 MG tablet Take 1 tablet (5 mg total) by mouth daily.   Tdap (BOOSTRIX) 5-2.5-18.5 LF-MCG/0.5 injection Inject 0.5 mLs into the muscle once for 1 dose.   [DISCONTINUED] ergocalciferol (DRISDOL) 1.25 MG (50000 UT) capsule Take 1 capsule (50,000 Units total) by mouth once a week.   [DISCONTINUED] Fe Cbn-Fe Gluc-FA-B12-C-DSS (FERRALET 90) 90-1 MG TABS Take 1 tablet by mouth daily.   No facility-administered encounter medications on file as of 11/30/2021.    Surgical History: Past Surgical History:  Procedure Laterality Date   CHOLECYSTECTOMY     TUBAL LIGATION      Medical History: Past Medical History:  Diagnosis Date   Hypertension     Family History: Family History  Problem Relation Age of Onset   Hypertension Mother    Diabetes Father    Diabetes Maternal Grandmother    Diabetes Paternal Grandmother     Social History   Socioeconomic History   Marital status: Single    Spouse name: Not on file  Number of children: Not on file   Years of education: Not on file   Highest education level: Not on file  Occupational History   Not on file  Tobacco Use   Smoking status: Never   Smokeless tobacco: Never  Substance and Sexual Activity   Alcohol use: No   Drug use: No   Sexual activity: Not on file  Other Topics Concern   Not on file  Social History Narrative   Not on file   Social Determinants of Health   Financial Resource Strain: Not on file  Food Insecurity: Not on file  Transportation  Needs: Not on file  Physical Activity: Not on file  Stress: Not on file  Social Connections: Not on file  Intimate Partner Violence: Not on file      Review of Systems  Constitutional:  Negative for chills, fatigue and unexpected weight change.  HENT:  Negative for congestion, rhinorrhea, sneezing and sore throat.   Eyes:  Negative for redness.  Respiratory:  Negative for cough, chest tightness and shortness of breath.   Cardiovascular:  Negative for chest pain and palpitations.  Gastrointestinal:  Negative for abdominal pain, constipation, diarrhea, nausea and vomiting.  Genitourinary:  Negative for dysuria and frequency.  Musculoskeletal:  Negative for arthralgias, back pain, joint swelling and neck pain.  Skin:  Negative for rash.  Neurological: Negative.  Negative for tremors and numbness.  Hematological:  Negative for adenopathy. Does not bruise/bleed easily.  Psychiatric/Behavioral:  Negative for behavioral problems (Depression), sleep disturbance and suicidal ideas. The patient is not nervous/anxious.     Vital Signs: BP (!) 149/87   Pulse 64   Temp 98 F (36.7 C)   Resp 16   Ht 5\' 7"  (1.702 m)   Wt 186 lb (84.4 kg)   SpO2 99%   BMI 29.13 kg/m    Physical Exam Vitals reviewed.  Constitutional:      General: She is not in acute distress.    Appearance: Normal appearance. She is obese. She is not ill-appearing.  HENT:     Head: Normocephalic and atraumatic.  Eyes:     Pupils: Pupils are equal, round, and reactive to light.  Cardiovascular:     Rate and Rhythm: Normal rate and regular rhythm.  Pulmonary:     Effort: Pulmonary effort is normal. No respiratory distress.  Neurological:     Mental Status: She is alert and oriented to person, place, and time.  Psychiatric:        Mood and Affect: Mood normal.        Behavior: Behavior normal.        Assessment/Plan: 1. Essential hypertension Continue amlodipine as prescribed, hydrochlorothiazide added due  to slight edema since starting amlodipine.  Lower extremity edema is a common side effect of amlodipine and diuretic medications often correct this problem.  We will follow-up in 3 to 4 weeks. - hydrochlorothiazide (HYDRODIURIL) 12.5 MG tablet; Take 1 tablet (12.5 mg total) by mouth daily.  Dispense: 90 tablet; Refill: 3 - amLODipine (NORVASC) 5 MG tablet; Take 1 tablet (5 mg total) by mouth daily.  Dispense: 90 tablet; Refill: 1  2. Primary insomnia Patient wants to try taking medication to help her sleep.  Trazodone prescribed will reevaluate at next office visit in 3 to 4 weeks.  3. Need for vaccination - Tdap (BOOSTRIX) 5-2.5-18.5 LF-MCG/0.5 injection; Inject 0.5 mLs into the muscle once for 1 dose.  Dispense: 0.5 mL; Refill: 0   General Counseling: Tiffany Campbell verbalizes  understanding of the findings of todays visit and agrees with plan of treatment. I have discussed any further diagnostic evaluation that may be needed or ordered today. We also reviewed her medications today. she has been encouraged to call the office with any questions or concerns that should arise related to todays visit.    No orders of the defined types were placed in this encounter.   Meds ordered this encounter  Medications   Tdap (BOOSTRIX) 5-2.5-18.5 LF-MCG/0.5 injection    Sig: Inject 0.5 mLs into the muscle once for 1 dose.    Dispense:  0.5 mL    Refill:  0   hydrochlorothiazide (HYDRODIURIL) 12.5 MG tablet    Sig: Take 1 tablet (12.5 mg total) by mouth daily.    Dispense:  90 tablet    Refill:  3   traZODone (DESYREL) 100 MG tablet    Sig: Take 1-2 tablets (100-200 mg total) by mouth at bedtime.    Dispense:  60 tablet    Refill:  2   amLODipine (NORVASC) 5 MG tablet    Sig: Take 1 tablet (5 mg total) by mouth daily.    Dispense:  90 tablet    Refill:  1    For future refills    Return in about 4 weeks (around 12/28/2021) for F/U, BP check, eval new med, Tiffany Campbell PCP.   Total time spent:30  Minutes Time spent includes review of chart, medications, test results, and follow up plan with the patient.   [place] Controlled Substance Database was reviewed by me.  This patient was seen by Sallyanne KusterAlyssa Larae Caison, FNP-C in collaboration with Dr. Beverely RisenFozia Khan as a part of collaborative care agreement.   Tiffany Morganti R. Tedd SiasAbernathy, MSN, FNP-C Internal medicine

## 2021-12-29 ENCOUNTER — Ambulatory Visit: Payer: Managed Care, Other (non HMO) | Admitting: Nurse Practitioner

## 2022-01-12 ENCOUNTER — Encounter: Payer: Self-pay | Admitting: Nurse Practitioner

## 2022-01-12 ENCOUNTER — Ambulatory Visit: Payer: Managed Care, Other (non HMO) | Admitting: Nurse Practitioner

## 2022-01-12 VITALS — BP 132/72 | HR 66 | Temp 98.3°F | Resp 16 | Ht 67.0 in | Wt 186.0 lb

## 2022-01-12 DIAGNOSIS — F5101 Primary insomnia: Secondary | ICD-10-CM | POA: Diagnosis not present

## 2022-01-12 DIAGNOSIS — I10 Essential (primary) hypertension: Secondary | ICD-10-CM

## 2022-01-12 DIAGNOSIS — K219 Gastro-esophageal reflux disease without esophagitis: Secondary | ICD-10-CM

## 2022-01-12 MED ORDER — METOCLOPRAMIDE HCL 5 MG PO TABS: 5.0000 mg | ORAL_TABLET | Freq: Three times a day (TID) | ORAL | 3 refills | Status: DC | PRN

## 2022-01-12 MED ORDER — TRAZODONE HCL 100 MG PO TABS
100.0000 mg | ORAL_TABLET | Freq: Every day | ORAL | 2 refills | Status: DC
Start: 2022-01-12 — End: 2022-04-07

## 2022-01-12 NOTE — Progress Notes (Signed)
[facility] [address] [place], [place] [address]  Internal MEDICINE  Office Visit Note  Patient Name: Tiffany Campbell  [id number]  [id number]  Date of Service: 01/12/2022  Chief Complaint  Patient presents with   Follow-up   Hypertension    HPI Tiffany Campbell presents for a follow up visit for hypertension. Her blood pressure is significantly improved. At her previous office visit, hydrochlorothiazide was added. She denies any adverse side effects of the new medication.  She also needs some refills today.   Current Medication: Outpatient Encounter Medications as of 01/12/2022  Medication Sig   amLODipine (NORVASC) 5 MG tablet Take 1 tablet (5 mg total) by mouth daily.   hydrochlorothiazide (HYDRODIURIL) 12.5 MG tablet Take 1 tablet (12.5 mg total) by mouth daily.   methocarbamol (ROBAXIN) 500 MG tablet Take 1 tablet (500 mg total) by mouth every 8 (eight) hours as needed for muscle spasms.   pantoprazole (PROTONIX) 40 MG tablet Take 1 tablet (40 mg total) by mouth daily.   [DISCONTINUED] metoCLOPramide (REGLAN) 5 MG tablet Take 1 tablet (5 mg total) by mouth every 8 (eight) hours as needed for nausea.   [DISCONTINUED] traZODone (DESYREL) 100 MG tablet Take 1-2 tablets (100-200 mg total) by mouth at bedtime.   metoCLOPramide (REGLAN) 5 MG tablet Take 1 tablet (5 mg total) by mouth every 8 (eight) hours as needed for nausea.   traZODone (DESYREL) 100 MG tablet Take 1-2 tablets (100-200 mg total) by mouth at bedtime.   No facility-administered encounter medications on file as of 01/12/2022.    Surgical History: Past Surgical History:  Procedure Laterality Date   CHOLECYSTECTOMY     TUBAL LIGATION      Medical History: Past Medical History:  Diagnosis Date   Hypertension     Family History: Family History  Problem Relation Age of Onset   Hypertension Mother    Diabetes Father    Diabetes Maternal Grandmother    Diabetes Paternal Grandmother     Social History    Socioeconomic History   Marital status: Single    Spouse name: Not on file   Number of children: Not on file   Years of education: Not on file   Highest education level: Not on file  Occupational History   Not on file  Tobacco Use   Smoking status: Never   Smokeless tobacco: Never  Substance and Sexual Activity   Alcohol use: No   Drug use: No   Sexual activity: Not on file  Other Topics Concern   Not on file  Social History Narrative   Not on file   Social Determinants of Health   Financial Resource Strain: Not on file  Food Insecurity: Not on file  Transportation Needs: Not on file  Physical Activity: Not on file  Stress: Not on file  Social Connections: Not on file  Intimate Partner Violence: Not on file      Review of Systems  Constitutional:  Negative for chills, fatigue and unexpected weight change.  HENT:  Negative for congestion, rhinorrhea, sneezing and sore throat.   Eyes:  Negative for redness.  Respiratory:  Negative for cough, chest tightness and shortness of breath.   Cardiovascular:  Negative for chest pain and palpitations.  Gastrointestinal:  Negative for abdominal pain, constipation, diarrhea, nausea and vomiting.  Genitourinary:  Negative for dysuria and frequency.  Musculoskeletal:  Negative for arthralgias, back pain, joint swelling and neck pain.  Skin:  Negative for rash.  Neurological: Negative.  Negative for tremors and numbness.  Hematological:  Negative for adenopathy. Does not bruise/bleed easily.  Psychiatric/Behavioral:  Negative for behavioral problems (Depression), sleep disturbance and suicidal ideas. The patient is not nervous/anxious.     Vital Signs: BP 132/72   Pulse 66   Temp 98.3 F (36.8 C)   Resp 16   Ht 5\' 7"  (1.702 m)   Wt 186 lb (84.4 kg)   SpO2 99%   BMI 29.13 kg/m    Physical Exam Vitals reviewed.  Constitutional:      General: She is not in acute distress.    Appearance: Normal appearance. She is  obese. She is not ill-appearing.  HENT:     Head: Normocephalic and atraumatic.  Eyes:     Pupils: Pupils are equal, round, and reactive to light.  Cardiovascular:     Rate and Rhythm: Normal rate and regular rhythm.  Pulmonary:     Effort: Pulmonary effort is normal. No respiratory distress.  Neurological:     Mental Status: She is alert and oriented to person, place, and time.  Psychiatric:        Mood and Affect: Mood normal.        Behavior: Behavior normal.        Assessment/Plan: 1. Essential hypertension Significantly improved with current medication regimen  2. Gastroesophageal reflux disease without esophagitis Refills ordered.  - metoCLOPramide (REGLAN) 5 MG tablet; Take 1 tablet (5 mg total) by mouth every 8 (eight) hours as needed for nausea.  Dispense: 90 tablet; Refill: 3  3. Primary insomnia Continue trazodone, refills ordered.  - traZODone (DESYREL) 100 MG tablet; Take 1-2 tablets (100-200 mg total) by mouth at bedtime.  Dispense: 60 tablet; Refill: 2   General Counseling: Tiffany Campbell verbalizes understanding of the findings of todays visit and agrees with plan of treatment. I have discussed any further diagnostic evaluation that may be needed or ordered today. We also reviewed her medications today. she has been encouraged to call the office with any questions or concerns that should arise related to todays visit.    No orders of the defined types were placed in this encounter.   Meds ordered this encounter  Medications   metoCLOPramide (REGLAN) 5 MG tablet    Sig: Take 1 tablet (5 mg total) by mouth every 8 (eight) hours as needed for nausea.    Dispense:  90 tablet    Refill:  3    For future refills   traZODone (DESYREL) 100 MG tablet    Sig: Take 1-2 tablets (100-200 mg total) by mouth at bedtime.    Dispense:  60 tablet    Refill:  2    Return in about 3 months (around 04/14/2022) for F/U, BP check, Hakeem Frazzini PCP.   Total time spent:30  Minutes Time spent includes review of chart, medications, test results, and follow up plan with the patient.   [place] Controlled Substance Database was reviewed by me.  This patient was seen by 04/16/2022, FNP-C in collaboration with Dr. Sallyanne Kuster as a part of collaborative care agreement.   Xavius Spadafore R. Beverely Risen, MSN, FNP-C Internal medicine

## 2022-01-23 ENCOUNTER — Encounter: Payer: Self-pay | Admitting: Nurse Practitioner

## 2022-01-29 ENCOUNTER — Encounter: Payer: Self-pay | Admitting: Nurse Practitioner

## 2022-03-09 ENCOUNTER — Other Ambulatory Visit: Payer: Self-pay

## 2022-03-09 DIAGNOSIS — I10 Essential (primary) hypertension: Secondary | ICD-10-CM

## 2022-03-09 MED ORDER — AMLODIPINE BESYLATE 5 MG PO TABS: 5.0000 mg | ORAL_TABLET | Freq: Every day | ORAL | 1 refills | Status: DC

## 2022-03-28 ENCOUNTER — Encounter: Payer: Managed Care, Other (non HMO) | Admitting: Physician Assistant

## 2022-03-31 ENCOUNTER — Telehealth: Payer: Self-pay | Admitting: Nurse Practitioner

## 2022-03-31 NOTE — Telephone Encounter (Signed)
Left vm and sent mychart message to confirm 04/07/22 appointment-Toni

## 2022-04-07 ENCOUNTER — Encounter: Payer: Self-pay | Admitting: Nurse Practitioner

## 2022-04-07 ENCOUNTER — Ambulatory Visit (INDEPENDENT_AMBULATORY_CARE_PROVIDER_SITE_OTHER): Payer: Managed Care, Other (non HMO) | Admitting: Nurse Practitioner

## 2022-04-07 VITALS — BP 138/85 | HR 80 | Temp 97.5°F | Resp 16 | Ht 67.0 in | Wt 182.6 lb

## 2022-04-07 DIAGNOSIS — Z0001 Encounter for general adult medical examination with abnormal findings: Secondary | ICD-10-CM | POA: Diagnosis not present

## 2022-04-07 DIAGNOSIS — F5101 Primary insomnia: Secondary | ICD-10-CM | POA: Diagnosis not present

## 2022-04-07 DIAGNOSIS — K219 Gastro-esophageal reflux disease without esophagitis: Secondary | ICD-10-CM | POA: Diagnosis not present

## 2022-04-07 DIAGNOSIS — R300 Dysuria: Secondary | ICD-10-CM

## 2022-04-07 DIAGNOSIS — I10 Essential (primary) hypertension: Secondary | ICD-10-CM | POA: Diagnosis not present

## 2022-04-07 MED ORDER — HYDROCHLOROTHIAZIDE 12.5 MG PO TABS: 12.5000 mg | ORAL_TABLET | Freq: Every day | ORAL | 3 refills | Status: DC

## 2022-04-07 MED ORDER — TRAZODONE HCL 100 MG PO TABS: 100.0000 mg | ORAL_TABLET | Freq: Every day | ORAL | 3 refills | Status: DC

## 2022-04-07 MED ORDER — PANTOPRAZOLE SODIUM 40 MG PO TBEC: 40.0000 mg | DELAYED_RELEASE_TABLET | Freq: Every day | ORAL | 1 refills | Status: DC

## 2022-04-07 MED ORDER — METOCLOPRAMIDE HCL 5 MG PO TABS: 5.0000 mg | ORAL_TABLET | Freq: Three times a day (TID) | ORAL | 3 refills | Status: DC | PRN

## 2022-04-07 NOTE — Progress Notes (Signed)
[facility] [address] [place], [place] [address]  Internal MEDICINE  Office Visit Note  Patient Name: Tiffany Campbell  [id number]  [id number]  Date of Service: 04/07/2022  Chief Complaint  Patient presents with   Annual Exam   Hypertension    HPI Tiffany Campbell presents for an annual well visit and physical exam.  --well-appearing 36 yo female with hypertension and GERD.  --Pap due next year --Labs done in April, grossly normal, deferred until next year --BP controlled, taking amlodipine 5 mg and HCTZ 12.5 mg --GERD controlled, on pantoprazole 40 mg and reglan 5 mg --lost 4 lbs since last visit --last year husband said she snores, making a gasping sound sometimes --trazodone has been helping her sleep better.      Current Medication: Outpatient Encounter Medications as of 04/07/2022  Medication Sig   amLODipine (NORVASC) 5 MG tablet Take 1 tablet (5 mg total) by mouth daily.   methocarbamol (ROBAXIN) 500 MG tablet Take 1 tablet (500 mg total) by mouth every 8 (eight) hours as needed for muscle spasms.   [DISCONTINUED] hydrochlorothiazide (HYDRODIURIL) 12.5 MG tablet Take 1 tablet (12.5 mg total) by mouth daily.   [DISCONTINUED] metoCLOPramide (REGLAN) 5 MG tablet Take 1 tablet (5 mg total) by mouth every 8 (eight) hours as needed for nausea.   [DISCONTINUED] pantoprazole (PROTONIX) 40 MG tablet Take 1 tablet (40 mg total) by mouth daily.   [DISCONTINUED] traZODone (DESYREL) 100 MG tablet Take 1-2 tablets (100-200 mg total) by mouth at bedtime.   hydrochlorothiazide (HYDRODIURIL) 12.5 MG tablet Take 1 tablet (12.5 mg total) by mouth daily.   metoCLOPramide (REGLAN) 5 MG tablet Take 1 tablet (5 mg total) by mouth every 8 (eight) hours as needed for nausea.   pantoprazole (PROTONIX) 40 MG tablet Take 1 tablet (40 mg total) by mouth daily.   traZODone (DESYREL) 100 MG tablet Take 1-2 tablets (100-200 mg total) by mouth at bedtime.   No facility-administered encounter  medications on file as of 04/07/2022.    Surgical History: Past Surgical History:  Procedure Laterality Date   CHOLECYSTECTOMY     TUBAL LIGATION      Medical History: Past Medical History:  Diagnosis Date   Hypertension     Family History: Family History  Problem Relation Age of Onset   Hypertension Mother    Diabetes Father    Diabetes Maternal Grandmother    Diabetes Paternal Grandmother     Social History   Socioeconomic History   Marital status: Single    Spouse name: Not on file   Number of children: Not on file   Years of education: Not on file   Highest education level: Not on file  Occupational History   Not on file  Tobacco Use   Smoking status: Never   Smokeless tobacco: Never  Substance and Sexual Activity   Alcohol use: No   Drug use: No   Sexual activity: Not on file  Other Topics Concern   Not on file  Social History Narrative   Not on file   Social Determinants of Health   Financial Resource Strain: Not on file  Food Insecurity: Not on file  Transportation Needs: Not on file  Physical Activity: Not on file  Stress: Not on file  Social Connections: Not on file  Intimate Partner Violence: Not on file      Review of Systems  Constitutional:  Negative for activity change, appetite change, chills, fatigue, fever and unexpected weight change.  HENT:  Negative.  Negative for congestion, ear pain, rhinorrhea, sore throat and trouble swallowing.   Eyes: Negative.   Respiratory: Negative.  Negative for cough, chest tightness, shortness of breath and wheezing.   Cardiovascular: Negative.  Negative for chest pain.  Gastrointestinal: Negative.  Negative for abdominal pain, blood in stool, constipation, diarrhea, nausea and vomiting.  Endocrine: Negative.   Genitourinary: Negative.  Negative for difficulty urinating, dysuria, frequency, hematuria and urgency.  Musculoskeletal: Negative.  Negative for arthralgias, back pain, joint swelling, myalgias  and neck pain.  Skin: Negative.  Negative for rash and wound.  Allergic/Immunologic: Negative.  Negative for immunocompromised state.  Neurological: Negative.  Negative for dizziness, seizures, numbness and headaches.  Hematological: Negative.   Psychiatric/Behavioral: Negative.  Negative for behavioral problems, self-injury and suicidal ideas. The patient is not nervous/anxious.     Vital Signs: BP 138/85   Pulse 80   Temp (!) 97.5 F (36.4 C)   Resp 16   Ht 5\' 7"  (1.702 m)   Wt 182 lb 9.6 oz (82.8 kg)   SpO2 99%   BMI 28.60 kg/m    Physical Exam Vitals reviewed.  Constitutional:      General: She is not in acute distress.    Appearance: She is well-developed. She is not diaphoretic.  HENT:     Head: Normocephalic and atraumatic.     Right Ear: Tympanic membrane, ear canal and external ear normal.     Left Ear: Tympanic membrane, ear canal and external ear normal.     Nose: Nose normal.     Mouth/Throat:     Mouth: Mucous membranes are moist.     Pharynx: Oropharynx is clear. No oropharyngeal exudate.  Eyes:     General: No scleral icterus.       Right eye: No discharge.        Left eye: No discharge.     Conjunctiva/sclera: Conjunctivae normal.     Pupils: Pupils are equal, round, and reactive to light.  Neck:     Thyroid: No thyromegaly.     Vascular: No JVD.     Trachea: No tracheal deviation.  Cardiovascular:     Rate and Rhythm: Normal rate and regular rhythm.     Heart sounds: Normal heart sounds. No murmur heard.    No friction rub. No gallop.  Pulmonary:     Effort: Pulmonary effort is normal. No respiratory distress.     Breath sounds: Normal breath sounds. No stridor. No wheezing or rales.  Chest:     Chest wall: No tenderness.  Breasts:    Breasts are symmetrical.     Right: Normal.     Left: Normal.  Abdominal:     General: Bowel sounds are normal. There is no distension.     Palpations: Abdomen is soft. There is no mass.     Tenderness: There  is no abdominal tenderness. There is no guarding or rebound.  Musculoskeletal:        General: No tenderness or deformity. Normal range of motion.     Cervical back: Normal range of motion and neck supple.  Lymphadenopathy:     Cervical: No cervical adenopathy.  Skin:    General: Skin is warm and dry.     Coloration: Skin is not pale.     Findings: No erythema or rash.  Neurological:     Mental Status: She is alert and oriented to person, place, and time.     Cranial Nerves: No cranial nerve deficit.  Motor: No abnormal muscle tone.     Coordination: Coordination normal.     Deep Tendon Reflexes: Reflexes are normal and symmetric.  Psychiatric:        Mood and Affect: Mood normal.        Behavior: Behavior normal.        Thought Content: Thought content normal.        Judgment: Judgment normal.        Assessment/Plan: 1. Encounter for general adult medical examination with abnormal findings Age-appropriate preventive screenings and vaccinations discussed, annual physical exam completed. Routine labs not ordered. Several labs were done in April, will repeat labs April 2024. PHM updated.   2. Essential hypertension Stable with amlodipine and HCTZ, continue as prescribed, no changes - hydrochlorothiazide (HYDRODIURIL) 12.5 MG tablet; Take 1 tablet (12.5 mg total) by mouth daily.  Dispense: 90 tablet; Refill: 3  3. Gastroesophageal reflux disease without esophagitis Stable with current meds, continue as prescribed - pantoprazole (PROTONIX) 40 MG tablet; Take 1 tablet (40 mg total) by mouth daily.  Dispense: 90 tablet; Refill: 1 - metoCLOPramide (REGLAN) 5 MG tablet; Take 1 tablet (5 mg total) by mouth every 8 (eight) hours as needed for nausea.  Dispense: 90 tablet; Refill: 3  4. Primary insomnia Continue trazodone as prescribed - traZODone (DESYREL) 100 MG tablet; Take 1-2 tablets (100-200 mg total) by mouth at bedtime.  Dispense: 180 tablet; Refill: 3  5. Dysuria Routine  urinalysis done - UA/M w/rflx Culture, Routine      General Counseling: Tiffany Campbell verbalizes understanding of the findings of todays visit and agrees with plan of treatment. I have discussed any further diagnostic evaluation that may be needed or ordered today. We also reviewed her medications today. she has been encouraged to call the office with any questions or concerns that should arise related to todays visit.    Orders Placed This Encounter  Procedures   UA/M w/rflx Culture, Routine    Meds ordered this encounter  Medications   pantoprazole (PROTONIX) 40 MG tablet    Sig: Take 1 tablet (40 mg total) by mouth daily.    Dispense:  90 tablet    Refill:  1   traZODone (DESYREL) 100 MG tablet    Sig: Take 1-2 tablets (100-200 mg total) by mouth at bedtime.    Dispense:  180 tablet    Refill:  3   hydrochlorothiazide (HYDRODIURIL) 12.5 MG tablet    Sig: Take 1 tablet (12.5 mg total) by mouth daily.    Dispense:  90 tablet    Refill:  3   metoCLOPramide (REGLAN) 5 MG tablet    Sig: Take 1 tablet (5 mg total) by mouth every 8 (eight) hours as needed for nausea.    Dispense:  90 tablet    Refill:  3    For future refills    Return in about 6 months (around 10/06/2022) for F/U, med refill, BP check, Jaymes Revels PCP.   Total time spent:30 Minutes Time spent includes review of chart, medications, test results, and follow up plan with the patient.   [place] Controlled Substance Database was reviewed by me.  This patient was seen by Sallyanne Kuster, FNP-C in collaboration with Dr. Beverely Risen as a part of collaborative care agreement.  Dariah Mcsorley R. Tedd Sias, MSN, FNP-C Internal medicine

## 2022-04-08 LAB — UA/M W/RFLX CULTURE, ROUTINE
Bilirubin, UA: NEGATIVE
Glucose, UA: NEGATIVE
Ketones, UA: NEGATIVE
Leukocytes,UA: NEGATIVE
Nitrite, UA: NEGATIVE
Protein,UA: NEGATIVE
Specific Gravity, UA: <=1.005 — AB (ref 1.005–1.030)
Urobilinogen, Ur: 0.2 mg/dL (ref 0.2–1.0)
pH, UA: 6.5 (ref 5.0–7.5)

## 2022-04-08 LAB — MICROSCOPIC EXAMINATION
Bacteria, UA: NONE SEEN
Casts: NONE SEEN /LPF
Epithelial Cells (non renal): NONE SEEN /HPF (ref 0–10)
WBC, UA: NONE SEEN /HPF (ref 0–5)

## 2022-04-13 ENCOUNTER — Ambulatory Visit: Payer: Managed Care, Other (non HMO) | Admitting: Nurse Practitioner

## 2022-07-11 ENCOUNTER — Other Ambulatory Visit: Payer: Self-pay | Admitting: Nurse Practitioner

## 2022-07-11 DIAGNOSIS — I10 Essential (primary) hypertension: Secondary | ICD-10-CM

## 2022-10-06 ENCOUNTER — Ambulatory Visit (INDEPENDENT_AMBULATORY_CARE_PROVIDER_SITE_OTHER): Payer: Managed Care, Other (non HMO) | Admitting: Nurse Practitioner

## 2022-10-06 ENCOUNTER — Encounter: Payer: Self-pay | Admitting: Nurse Practitioner

## 2022-10-06 VITALS — BP 180/100 | HR 79 | Temp 97.5°F | Resp 16 | Ht 67.0 in | Wt 166.6 lb

## 2022-10-06 DIAGNOSIS — I10 Essential (primary) hypertension: Secondary | ICD-10-CM | POA: Diagnosis not present

## 2022-10-06 DIAGNOSIS — R600 Localized edema: Secondary | ICD-10-CM

## 2022-10-06 MED ORDER — HYDROCHLOROTHIAZIDE 25 MG PO TABS: 25.0000 mg | ORAL_TABLET | Freq: Every day | ORAL | 1 refills | Status: DC

## 2022-10-06 NOTE — Progress Notes (Signed)
[facility] [place], [place] [address]  Internal MEDICINE  Office Visit Note  Patient Name: Tiffany Campbell  [id number]  [id number]  Date of Service: 10/06/2022  Chief Complaint  Patient presents with   Hypertension   Follow-up    Med refills.     HPI Tiffany Campbell presents for a follow-up visit for hypertension Hypertension -- remembers to take her medications most of the time. Forgot to take them this morning. Reports having headaches more often. Still has some swelling in her legs which she notices when she takes off her socks.    Current Medication: Outpatient Encounter Medications as of 10/06/2022  Medication Sig   amLODipine (NORVASC) 5 MG tablet TAKE 1 TABLET BY MOUTH DAILY   hydrochlorothiazide (HYDRODIURIL) 25 MG tablet Take 1 tablet (25 mg total) by mouth daily.   methocarbamol (ROBAXIN) 500 MG tablet Take 1 tablet (500 mg total) by mouth every 8 (eight) hours as needed for muscle spasms.   metoCLOPramide (REGLAN) 5 MG tablet Take 1 tablet (5 mg total) by mouth every 8 (eight) hours as needed for nausea.   pantoprazole (PROTONIX) 40 MG tablet Take 1 tablet (40 mg total) by mouth daily.   traZODone (DESYREL) 100 MG tablet Take 1-2 tablets (100-200 mg total) by mouth at bedtime.   [DISCONTINUED] hydrochlorothiazide (HYDRODIURIL) 12.5 MG tablet Take 1 tablet (12.5 mg total) by mouth daily.   No facility-administered encounter medications on file as of 10/06/2022.    Surgical History: Past Surgical History:  Procedure Laterality Date   CHOLECYSTECTOMY     TUBAL LIGATION      Medical History: Past Medical History:  Diagnosis Date   Hypertension     Family History: Family History  Problem Relation Age of Onset   Hypertension Mother    Diabetes Father    Diabetes Maternal Grandmother    Diabetes Paternal Grandmother     Social History   Socioeconomic History   Marital status: Single    Spouse name: Not on file   Number of children: Not  on file   Years of education: Not on file   Highest education level: Not on file  Occupational History   Not on file  Tobacco Use   Smoking status: Never   Smokeless tobacco: Never  Substance and Sexual Activity   Alcohol use: No   Drug use: No   Sexual activity: Not on file  Other Topics Concern   Not on file  Social History Narrative   Not on file   Social Determinants of Health   Financial Resource Strain: Not on file  Food Insecurity: Not on file  Transportation Needs: Not on file  Physical Activity: Not on file  Stress: Not on file  Social Connections: Not on file  Intimate Partner Violence: Not on file      Review of Systems  Constitutional:  Negative for chills, fatigue and unexpected weight change.  HENT:  Negative for congestion, rhinorrhea, sneezing and sore throat.   Eyes:  Negative for redness.  Respiratory:  Negative for cough, chest tightness and shortness of breath.   Cardiovascular:  Negative for chest pain and palpitations.  Gastrointestinal:  Negative for abdominal pain, constipation, diarrhea, nausea and vomiting.  Genitourinary:  Negative for dysuria and frequency.  Musculoskeletal:  Negative for arthralgias, back pain, joint swelling and neck pain.  Skin:  Negative for rash.  Neurological:  Positive for headaches. Negative for tremors and numbness.  Hematological:  Negative for adenopathy. Does not  bruise/bleed easily.  Psychiatric/Behavioral:  Negative for behavioral problems (Depression), sleep disturbance and suicidal ideas. The patient is not nervous/anxious.     Vital Signs: BP (!) 180/100 Comment: 192/113  Pulse 79   Temp (!) 97.5 F (36.4 C)   Resp 16   Ht 5\' 7"  (1.702 m)   Wt 166 lb 9.6 oz (75.6 kg)   SpO2 99%   BMI 26.09 kg/m    Physical Exam Vitals reviewed.  Constitutional:      General: She is not in acute distress.    Appearance: Normal appearance. She is not ill-appearing.  HENT:     Head: Normocephalic and atraumatic.   Eyes:     Pupils: Pupils are equal, round, and reactive to light.  Cardiovascular:     Rate and Rhythm: Normal rate and regular rhythm.  Neurological:     Mental Status: She is alert and oriented to person, place, and time.  Psychiatric:        Mood and Affect: Mood normal.        Behavior: Behavior normal.        Assessment/Plan: 1. Essential hypertension HCTZ dose increased, and continue amlodipine 5 mg daily. Follow up in 4 weeks to reassess blood pressure. Reminded to take BP medications at least 1 hour before her next appointment - hydrochlorothiazide (HYDRODIURIL) 25 MG tablet; Take 1 tablet (25 mg total) by mouth daily.  Dispense: 90 tablet; Refill: 1  2. Bilateral lower extremity edema HCTZ dose increased which should improve the swelling.  - hydrochlorothiazide (HYDRODIURIL) 25 MG tablet; Take 1 tablet (25 mg total) by mouth daily.  Dispense: 90 tablet; Refill: 1   General Counseling: Mahsa verbalizes understanding of the findings of todays visit and agrees with plan of treatment. I have discussed any further diagnostic evaluation that may be needed or ordered today. We also reviewed her medications today. she has been encouraged to call the office with any questions or concerns that should arise related to todays visit.    No orders of the defined types were placed in this encounter.   Meds ordered this encounter  Medications   hydrochlorothiazide (HYDRODIURIL) 25 MG tablet    Sig: Take 1 tablet (25 mg total) by mouth daily.    Dispense:  90 tablet    Refill:  1    Discontinue 12.5 dose, and fill new script for 25 mg dose.    Return in about 4 weeks (around 11/03/2022) for F/U, BP check, Isabellarose Kope PCP, please take BP meds before appointment . Marland Kitchen   Total time spent:20 Minutes Time spent includes review of chart, medications, test results, and follow up plan with the patient.   [place] Controlled Substance Database was reviewed by me.  This patient was seen by Jonetta Osgood, FNP-C in collaboration with Dr. Clayborn Bigness as a part of collaborative care agreement.   Sharmel Ballantine R. Valetta Fuller, MSN, FNP-C Internal medicine

## 2022-11-04 ENCOUNTER — Ambulatory Visit (INDEPENDENT_AMBULATORY_CARE_PROVIDER_SITE_OTHER): Payer: Managed Care, Other (non HMO) | Admitting: Nurse Practitioner

## 2022-11-04 ENCOUNTER — Encounter: Payer: Self-pay | Admitting: Nurse Practitioner

## 2022-11-04 VITALS — BP 132/82 | HR 92 | Temp 97.6°F | Resp 16 | Ht 67.0 in | Wt 169.4 lb

## 2022-11-04 DIAGNOSIS — I10 Essential (primary) hypertension: Secondary | ICD-10-CM | POA: Diagnosis not present

## 2022-11-04 DIAGNOSIS — R600 Localized edema: Secondary | ICD-10-CM | POA: Diagnosis not present

## 2022-11-04 NOTE — Progress Notes (Signed)
[facility] [address] [place], [place] [address]  Internal MEDICINE  Office Visit Note  Patient Name: Tiffany Campbell  [id number]  [id number]  Date of Service: 11/04/2022  Chief Complaint  Patient presents with   Hypertension   Follow-up    HPI Tiffany Campbell presents for a follow-up visit for hypertension Hydrochlorothiazide dose was increased at her last office visit.  Swelling is better and blood pressure has improved.     Current Medication: Outpatient Encounter Medications as of 11/04/2022  Medication Sig   amLODipine (NORVASC) 5 MG tablet TAKE 1 TABLET BY MOUTH DAILY   hydrochlorothiazide (HYDRODIURIL) 25 MG tablet Take 1 tablet (25 mg total) by mouth daily.   methocarbamol (ROBAXIN) 500 MG tablet Take 1 tablet (500 mg total) by mouth every 8 (eight) hours as needed for muscle spasms.   metoCLOPramide (REGLAN) 5 MG tablet Take 1 tablet (5 mg total) by mouth every 8 (eight) hours as needed for nausea.   pantoprazole (PROTONIX) 40 MG tablet Take 1 tablet (40 mg total) by mouth daily.   traZODone (DESYREL) 100 MG tablet Take 1-2 tablets (100-200 mg total) by mouth at bedtime.   No facility-administered encounter medications on file as of 11/04/2022.    Surgical History: Past Surgical History:  Procedure Laterality Date   CHOLECYSTECTOMY     TUBAL LIGATION      Medical History: Past Medical History:  Diagnosis Date   Hypertension     Family History: Family History  Problem Relation Age of Onset   Hypertension Mother    Diabetes Father    Diabetes Maternal Grandmother    Diabetes Paternal Grandmother     Social History   Socioeconomic History   Marital status: Single    Spouse name: Not on file   Number of children: Not on file   Years of education: Not on file   Highest education level: Not on file  Occupational History   Not on file  Tobacco Use   Smoking status: Never   Smokeless tobacco: Never  Substance and Sexual Activity   Alcohol  use: No   Drug use: No   Sexual activity: Not on file  Other Topics Concern   Not on file  Social History Narrative   Not on file   Social Determinants of Health   Financial Resource Strain: Not on file  Food Insecurity: Not on file  Transportation Needs: Not on file  Physical Activity: Not on file  Stress: Not on file  Social Connections: Not on file  Intimate Partner Violence: Not on file      Review of Systems  Constitutional:  Negative for chills, fatigue and unexpected weight change.  HENT:  Negative for congestion, rhinorrhea, sneezing and sore throat.   Eyes:  Negative for redness.  Respiratory:  Negative for cough, chest tightness and shortness of breath.   Cardiovascular:  Negative for chest pain and palpitations.  Gastrointestinal:  Negative for abdominal pain, constipation, diarrhea, nausea and vomiting.  Genitourinary:  Negative for dysuria and frequency.  Musculoskeletal:  Negative for arthralgias, back pain, joint swelling and neck pain.  Skin:  Negative for rash.  Neurological:  Negative for tremors, numbness and headaches.  Hematological:  Negative for adenopathy. Does not bruise/bleed easily.  Psychiatric/Behavioral:  Negative for behavioral problems (Depression), sleep disturbance and suicidal ideas. The patient is not nervous/anxious.     Vital Signs: BP 132/82 Comment: 149/96  Pulse 92   Temp 97.6 F (36.4 C)   Resp 16  Ht  (1.702 m)   Wt 169 lb 6.4 oz (76.8 kg)   SpO2 98%   BMI 26.53 kg/m    Physical Exam Vitals reviewed.  Constitutional:      General: She is not in acute distress.    Appearance: Normal appearance. She is not ill-appearing.  HENT:     Head: Normocephalic and atraumatic.  Eyes:     Pupils: Pupils are equal, round, and reactive to light.  Cardiovascular:     Rate and Rhythm: Normal rate and regular rhythm.  Neurological:     Mental Status: She is alert and oriented to person, place, and time.  Psychiatric:         Mood and Affect: Mood normal.        Behavior: Behavior normal.        Assessment/Plan: 1. Essential hypertension Continue amlodipine and HCTZ as prescribed.   2. Bilateral lower extremity edema Improved with increased HCTZ dose.    General Counseling: Tiffany Campbell verbalizes understanding of the findings of todays visit and agrees with plan of treatment. I have discussed any further diagnostic evaluation that may be needed or ordered today. We also reviewed her medications today. she has been encouraged to call the office with any questions or concerns that should arise related to todays visit.    No orders of the defined types were placed in this encounter.   No orders of the defined types were placed in this encounter.   Return in about 3 months (around 02/03/2023) for F/U, BP check, Tiffany Campbell PCP.   Total time spent:20 Minutes Time spent includes review of chart, medications, test results, and follow up plan with the patient.   [place] Controlled Substance Database was reviewed by me.  This patient was seen by Tiffany Kuster, FNP-C in collaboration with Dr. Beverely Risen as a part of collaborative care agreement.   Tiffany Campbell R. Tedd Sias, MSN, FNP-C Internal medicine

## 2023-02-02 ENCOUNTER — Ambulatory Visit: Payer: Managed Care, Other (non HMO) | Admitting: Nurse Practitioner

## 2023-02-13 ENCOUNTER — Ambulatory Visit: Payer: Managed Care, Other (non HMO) | Admitting: Nurse Practitioner

## 2023-04-13 ENCOUNTER — Encounter: Payer: Managed Care, Other (non HMO) | Admitting: Nurse Practitioner

## 2023-05-20 ENCOUNTER — Other Ambulatory Visit: Payer: Self-pay | Admitting: Nurse Practitioner

## 2023-05-20 DIAGNOSIS — I10 Essential (primary) hypertension: Secondary | ICD-10-CM

## 2023-05-22 NOTE — Telephone Encounter (Signed)
Pt missed several appt

## 2023-05-23 ENCOUNTER — Telehealth: Payer: Self-pay

## 2023-05-23 ENCOUNTER — Telehealth: Payer: Self-pay | Admitting: Nurse Practitioner

## 2023-05-23 NOTE — Telephone Encounter (Signed)
Try to call pt for appt due to 3 no show and med refills no answer Tiffany Campbell will send her letter and also send message to phar as per alyssa send 30 days med and pt need appt for further refills

## 2023-05-23 NOTE — Telephone Encounter (Signed)
Appointment letter mailed to patient-Tiffany Campbell 

## 2023-05-23 NOTE — Telephone Encounter (Signed)
Pt need appt for refills only 30 days

## 2023-06-16 ENCOUNTER — Other Ambulatory Visit: Payer: Self-pay | Admitting: Nurse Practitioner

## 2023-06-16 DIAGNOSIS — I10 Essential (primary) hypertension: Secondary | ICD-10-CM

## 2023-06-19 NOTE — Telephone Encounter (Signed)
Patient has 11 refills

## 2024-03-14 ENCOUNTER — Ambulatory Visit

## 2024-04-01 ENCOUNTER — Ambulatory Visit (INDEPENDENT_AMBULATORY_CARE_PROVIDER_SITE_OTHER)

## 2024-04-01 VITALS — BP 136/88 | HR 84 | Temp 98.5°F | Ht 67.0 in | Wt 199.0 lb

## 2024-04-01 DIAGNOSIS — F5101 Primary insomnia: Secondary | ICD-10-CM | POA: Diagnosis not present

## 2024-04-01 DIAGNOSIS — E611 Iron deficiency: Secondary | ICD-10-CM

## 2024-04-01 DIAGNOSIS — R5383 Other fatigue: Secondary | ICD-10-CM | POA: Diagnosis not present

## 2024-04-01 DIAGNOSIS — N921 Excessive and frequent menstruation with irregular cycle: Secondary | ICD-10-CM

## 2024-04-01 DIAGNOSIS — E559 Vitamin D deficiency, unspecified: Secondary | ICD-10-CM

## 2024-04-01 DIAGNOSIS — E669 Obesity, unspecified: Secondary | ICD-10-CM | POA: Insufficient documentation

## 2024-04-01 DIAGNOSIS — B353 Tinea pedis: Secondary | ICD-10-CM | POA: Insufficient documentation

## 2024-04-01 DIAGNOSIS — N920 Excessive and frequent menstruation with regular cycle: Secondary | ICD-10-CM | POA: Insufficient documentation

## 2024-04-01 DIAGNOSIS — I10 Essential (primary) hypertension: Secondary | ICD-10-CM

## 2024-04-01 NOTE — Progress Notes (Signed)
 -   New Patient Visit   Physician: Brittiny Levitz A Hera Celaya, MD  Patient: Tiffany Campbell   DOB: Jan 22, 1986   38 y.o. Female  MRN: [id number] Visit Date: 04/01/2024   Chief Complaint  Patient presents with   Establish Care    Declines flu shot--- Pap-2021   Subjective  Tiffany Campbell is a 38 y.o. female who presents today as a new patient to establish care.   HPI  Discussed the use of AI scribe software for clinical note transcription with the patient, who gave verbal consent to proceed.  History of Present Illness   [name] is a 38 year old female with hypertension who presents with headaches and irregular menstrual cycles.  Hypertension, uncontrolled - History of hypertension - Currently taking hydrochlorothiazide  25 mg and amlodipine  5 mg - Does not regularly monitor blood pressure at home - BP today 136/88  - Does not always take medication   Menstrual irregularity and dysmenorrhea - Irregular menstrual cycles, with menses occurring twice in one month this month - Heavy menstrual flow requiring 8-10 pads or tampons per day - Severe cramping, especially during the first two days of menses - No current use of birth control due to history of tubal ligation - History of abnormal pap smear in high school requiring colposcopy, G3P3   Sleep disturbance - Sleep disruption with awakening 3-4 times nightly for the past year - Previously tried trazodone  for sleep without significant benefit - Unable to recall another sleep medication previously tried - No snoring or gasping for air during sleep  Easy bruising and cutaneous findings - Easy bruising without known trauma - Small blotches appearing on skin  Tinea pedis - Pruritic rash on feet  - Severe itching and peeling of skin      ASSESSMENT & PLAN  Encounter Diagnoses  Name Primary?   Primary insomnia Yes   Other fatigue    Iron deficiency    Menorrhagia with irregular cycle    Vitamin D  deficiency     Essential hypertension    Tinea pedis of both feet    Obesity (BMI 30-39.9)     Orders Placed This Encounter  Procedures   CBC with Differential/Platelet   Comprehensive metabolic panel with GFR   Hemoglobin A1c   Lipid panel   Urinalysis, Routine w reflex microscopic   TSH + free T4   Iron, TIBC and Ferritin Panel   VITAMIN D  25 Hydroxy (Vit-D Deficiency, Fractures)   Ambulatory referral to Obstetrics / Gynecology    Assessment and Plan    Heavy menstrual bleeding Experiencing heavy menstrual bleeding - query other gynecological issues since this is new onset - Advise use of ibuprofen three times daily during heavy days to slow bleeding - Refer to gynecology for further evaluation of heavy bleeding and cramping - Check iron levels to assess for iron deficiency, TSH, platelets.   If further suspicion of bleeding disorder, will check additional coagulation markers. - PT/INR, aPTT, vFw.  Note higher risk for autoimmune disorders due to FH  Iron deficiency (suspected) Suspected iron deficiency due to heavy menstrual bleeding and presence of a flow murmur, which may indicate anemia. - Check iron levels as part of blood work  Hypertension Blood pressure reading of 136/88, which is slightly elevated. Currently on hydrochlorothiazide  and amlodipine  but not consistently taking medication. No home blood pressure monitoring. - Advise checking blood pressure at home, ensuring to sit for 10-15 minutes before taking a reading - Schedule follow-up in  four weeks to reassess blood pressure control  Primary insomnia Experiencing difficulty sleeping, waking up 3-4 times per night for the past year. Previous use of trazodone  was not effective. Discussed various over-the-counter and lifestyle options for improving sleep. - Recommend trying magnesium  glycinate for anxiety and muscle relaxation - Suggest Unisom (doxylamine) as an over-the-counter option, with caution for morning grogginess - May try  sleep tea with valerian - Instruct to report back if these options do not work for further evaluation and potential prescription options  Vitamin D  deficiency Vitamin D  deficiency with limited sun exposure due to work environment. Discussed benefits of vitamin D  for bone density, hormone function, and reducing risk of URTI's - Recommend taking 2000 units of vitamin D  daily - Check vitamin D  levels with blood work  Tinea pedis (athlete's foot) Itchy rash on both feet, likely athlete's foot. Previous use of cream was ineffective due to improper application. - Advise using over-the-counter antifungal cream at night to prevent sweating off - Instruct to report back if symptoms persist  Easy bruising (under evaluation) Reports easy bruising without known trauma. Requires further evaluation to determine underlying cause. - Include platelet count and other relevant tests in blood work to evaluate for potential causes of easy bruising     F/u in 3 weeks   Objective  BP 136/88 (BP Location: Left Arm, Patient Position: Sitting, Cuff Size: Large)   Pulse 84   Temp 98.5 F (36.9 C) (Oral)   Ht 5' 7 (1.702 m)   Wt 199 lb (90.3 kg)   LMP 03/25/2024   BMI 31.17 kg/m      Review of Systems  Constitutional:  Negative for chills, fever and weight loss.  Eyes:  Negative for blurred vision. h Respiratory:  Negative for cough and shortness of breath.   Cardiovascular:  Negative for chest pain and palpitations.  Skin:  Negative for rash.  Psychiatric/Behavioral:  Negative for depression. The patient is not nervous/anxious.      Physical Exam Physical Exam Vitals reviewed.  Constitutional:      Appearance: Normal appearance. Well-developed , overweight HENT:     Head: Normocephalic and atraumatic.  Normal mucous membranes, no oral lesions Eyes:     Pupils: Pupils are equal, round, and reactive to light.  Neck:     Thyroid: No thyroid mass or thyromegaly.  Cardiovascular:     Rate and  Rhythm: Normal rate and regular rhythm. Normal heart sounds - grade I systolic flow murmur. Normal peripheral pulses Pulmonary:     Normal breath sounds with normal effort Abdominal:   Abdomen is soft, without tenderness or noted hepatosplenomegaly Musculoskeletal:        General: No swelling or edema  Lymphadenopathy:     Cervical: No cervical adenopathy.  Skin:    General: Skin is warm and dry without noticeable rash. Neurological:     General: No focal deficit present.  Psychiatric:        Mood and Affect: Mood, behavior and cognition normal   Past Medical History:  Diagnosis Date   Gastroesophageal reflux disease without esophagitis 01/17/2018   Hypertension    Past Surgical History:  Procedure Laterality Date   CHOLECYSTECTOMY     TUBAL LIGATION     Family Status  Relation Name Status   Mother  Alive   Father  Deceased   MGM  (Not Specified)   PGM  (Not Specified)  No partnership data on file   Family History  Problem Relation Age of  Onset   Hypertension Mother    Diabetes Father    Pancreatic cancer Father    Diabetes Maternal Grandmother    Diabetes Paternal Grandmother    Social History   Socioeconomic History   Marital status: Single    Spouse name: Not on file   Number of children: Not on file   Years of education: Not on file   Highest education level: Not on file  Occupational History   Not on file  Tobacco Use   Smoking status: Every Day    Types: E-cigarettes   Smokeless tobacco: Never  Substance and Sexual Activity   Alcohol use: Yes    Comment: occasional   Drug use: No   Sexual activity: Not on file  Other Topics Concern   Not on file  Social History Narrative   Not on file   Social Drivers of Health   Financial Resource Strain: Not on file  Food Insecurity: Not on file  Transportation Needs: Not on file  Physical Activity: Not on file  Stress: Not on file  Social Connections: Not on file   Outpatient Medications Prior to  Visit  Medication Sig   amLODipine  (NORVASC ) 5 MG tablet TAKE 1 TABLET BY MOUTH DAILY   hydrochlorothiazide  (HYDRODIURIL ) 25 MG tablet Take 1 tablet (25 mg total) by mouth daily.   [DISCONTINUED] traZODone  (DESYREL ) 100 MG tablet Take 1-2 tablets (100-200 mg total) by mouth at bedtime.   [DISCONTINUED] methocarbamol  (ROBAXIN ) 500 MG tablet Take 1 tablet (500 mg total) by mouth every 8 (eight) hours as needed for muscle spasms.   [DISCONTINUED] metoCLOPramide  (REGLAN ) 5 MG tablet Take 1 tablet (5 mg total) by mouth every 8 (eight) hours as needed for nausea.   [DISCONTINUED] pantoprazole  (PROTONIX ) 40 MG tablet Take 1 tablet (40 mg total) by mouth daily.   No facility-administered medications prior to visit.   No Known Allergies  Immunization History  Administered Date(s) Administered   Tdap 09/22/2008    Health Maintenance  Topic Date Due   HIV Screening  Never done   Pneumococcal Vaccine (1 of 2 - PCV) Never done   Hepatitis B Vaccines 19-59 Average Risk (1 of 3 - 19+ 3-dose series) Never done   HPV VACCINES (1 - 3-dose SCDM series) Never done   Cervical Cancer Screening (HPV/Pap Cotest)  12/29/2024   Meningococcal B Vaccine  Aged Out   DTaP/Tdap/Td  Discontinued   Influenza Vaccine  Discontinued   COVID-19 Vaccine  Discontinued   Hepatitis C Screening  Discontinued    Patient Care Team: Everlene Parris LABOR, MD as PCP - General (Family Medicine) Dellie Louanne MATSU, MD (General Surgery)  Depression Screen    04/01/2024    2:16 PM 10/06/2022    8:20 AM 04/07/2022    9:10 AM 11/30/2021   10:37 AM  PHQ 2/9 Scores  PHQ - 2 Score 2 2 0 0  PHQ- 9 Score 7 5       Yoan Sallade LABOR Everlene, MD  [facility] [phone] (phone) [phone] (fax)  [facility]

## 2024-04-26 ENCOUNTER — Other Ambulatory Visit (INDEPENDENT_AMBULATORY_CARE_PROVIDER_SITE_OTHER)

## 2024-04-26 DIAGNOSIS — E559 Vitamin D deficiency, unspecified: Secondary | ICD-10-CM

## 2024-04-26 DIAGNOSIS — E611 Iron deficiency: Secondary | ICD-10-CM

## 2024-04-26 DIAGNOSIS — I10 Essential (primary) hypertension: Secondary | ICD-10-CM

## 2024-04-26 DIAGNOSIS — N921 Excessive and frequent menstruation with irregular cycle: Secondary | ICD-10-CM

## 2024-04-26 DIAGNOSIS — R5383 Other fatigue: Secondary | ICD-10-CM

## 2024-04-26 DIAGNOSIS — F5101 Primary insomnia: Secondary | ICD-10-CM

## 2024-04-27 LAB — COMPREHENSIVE METABOLIC PANEL WITH GFR
AG Ratio: 1.3 (calc) (ref 1.0–2.5)
ALT: 10 U/L (ref 6–29)
AST: 13 U/L (ref 10–30)
Albumin: 4.2 g/dL (ref 3.6–5.1)
Alkaline phosphatase (APISO): 52 U/L (ref 31–125)
BUN: 16 mg/dL (ref 7–25)
CO2: 25 mmol/L (ref 20–32)
Calcium: 9 mg/dL (ref 8.6–10.2)
Chloride: 107 mmol/L (ref 98–110)
Creat: 0.95 mg/dL (ref 0.50–0.97)
Globulin: 3.2 g/dL (ref 1.9–3.7)
Glucose, Bld: 122 mg/dL — ABNORMAL HIGH (ref 65–99)
Potassium: 4 mmol/L (ref 3.5–5.3)
Sodium: 140 mmol/L (ref 135–146)
Total Bilirubin: 0.6 mg/dL (ref 0.2–1.2)
Total Protein: 7.4 g/dL (ref 6.1–8.1)
eGFR: 78 mL/min/1.73m2 (ref >=60)

## 2024-04-27 LAB — CBC WITH DIFFERENTIAL/PLATELET
Absolute Lymphocytes: 2138 {cells}/uL (ref 850–3900)
Absolute Monocytes: 739 {cells}/uL (ref 200–950)
Basophils Absolute: 59 {cells}/uL (ref 0–200)
Basophils Relative: 0.9 %
Eosinophils Absolute: 132 {cells}/uL (ref 15–500)
Eosinophils Relative: 2 %
HCT: 37.9 % (ref 35.0–45.0)
Hemoglobin: 12.2 g/dL (ref 11.7–15.5)
MCH: 30 pg (ref 27.0–33.0)
MCHC: 32.2 g/dL (ref 32.0–36.0)
MCV: 93.3 fL (ref 80.0–100.0)
MPV: 10.8 fL (ref 7.5–12.5)
Monocytes Relative: 11.2 %
Neutro Abs: 3531 {cells}/uL (ref 1500–7800)
Neutrophils Relative %: 53.5 %
Platelets: 301 Thousand/uL (ref 140–400)
RBC: 4.06 Million/uL (ref 3.80–5.10)
RDW: 15.6 % — ABNORMAL HIGH (ref 11.0–15.0)
Total Lymphocyte: 32.4 %
WBC: 6.6 Thousand/uL (ref 3.8–10.8)

## 2024-04-27 LAB — URINALYSIS, ROUTINE W REFLEX MICROSCOPIC
Bacteria, UA: NONE SEEN /HPF
Bilirubin Urine: NEGATIVE
Glucose, UA: NEGATIVE
Hgb urine dipstick: NEGATIVE
Hyaline Cast: NONE SEEN /LPF
Ketones, ur: NEGATIVE
Leukocytes,Ua: NEGATIVE
Nitrite: NEGATIVE
Specific Gravity, Urine: 1.025 (ref 1.001–1.035)
WBC, UA: NONE SEEN /HPF (ref 0–5)
pH: 7 (ref 5.0–8.0)

## 2024-04-27 LAB — IRON,TIBC AND FERRITIN PANEL
%SAT: 17 % (ref 16–45)
Ferritin: 7 ng/mL — ABNORMAL LOW (ref 16–154)
Iron: 78 ug/dL (ref 40–190)
TIBC: 456 ug/dL — ABNORMAL HIGH (ref 250–450)

## 2024-04-27 LAB — LIPID PANEL
Cholesterol: 226 mg/dL — ABNORMAL HIGH (ref <200)
HDL: 66 mg/dL (ref >=50)
LDL Cholesterol (Calc): 142 mg/dL — ABNORMAL HIGH
Non-HDL Cholesterol (Calc): 160 mg/dL — ABNORMAL HIGH (ref <130)
Total CHOL/HDL Ratio: 3.4 (calc) (ref <5.0)
Triglycerides: 80 mg/dL (ref <150)

## 2024-04-27 LAB — VITAMIN D 25 HYDROXY (VIT D DEFICIENCY, FRACTURES): Vit D, 25-Hydroxy: 20 ng/mL — ABNORMAL LOW (ref 30–100)

## 2024-04-27 LAB — HEMOGLOBIN A1C
Hgb A1c MFr Bld: 5.4 % (ref <5.7)
Mean Plasma Glucose: 108 mg/dL
eAG (mmol/L): 6 mmol/L

## 2024-04-27 LAB — TSH+FREE T4: TSH W/REFLEX TO FT4: 1.32 m[IU]/L

## 2024-04-27 LAB — MICROSCOPIC MESSAGE

## 2024-05-01 ENCOUNTER — Ambulatory Visit (INDEPENDENT_AMBULATORY_CARE_PROVIDER_SITE_OTHER)

## 2024-05-01 VITALS — BP 150/102 | HR 93 | Ht 67.0 in | Wt 199.2 lb

## 2024-05-01 DIAGNOSIS — E611 Iron deficiency: Secondary | ICD-10-CM

## 2024-05-01 DIAGNOSIS — F5101 Primary insomnia: Secondary | ICD-10-CM | POA: Diagnosis not present

## 2024-05-01 DIAGNOSIS — E782 Mixed hyperlipidemia: Secondary | ICD-10-CM

## 2024-05-01 DIAGNOSIS — R21 Rash and other nonspecific skin eruption: Secondary | ICD-10-CM

## 2024-05-01 DIAGNOSIS — E785 Hyperlipidemia, unspecified: Secondary | ICD-10-CM | POA: Insufficient documentation

## 2024-05-01 DIAGNOSIS — I10 Essential (primary) hypertension: Secondary | ICD-10-CM | POA: Diagnosis not present

## 2024-05-01 DIAGNOSIS — E559 Vitamin D deficiency, unspecified: Secondary | ICD-10-CM | POA: Diagnosis not present

## 2024-05-01 MED ORDER — TRIAMCINOLONE ACETONIDE 0.1 % EX CREA: 1.0000 | TOPICAL_CREAM | Freq: Two times a day (BID) | CUTANEOUS | 0 refills | Status: AC

## 2024-05-01 MED ORDER — CHLORTHALIDONE 25 MG PO TABS: 25.0000 mg | ORAL_TABLET | Freq: Every day | ORAL | 1 refills | Status: AC

## 2024-05-01 MED ORDER — RAMELTEON 8 MG PO TABS: 8.0000 mg | ORAL_TABLET | Freq: Every day | ORAL | 0 refills | Status: AC

## 2024-05-01 MED ORDER — IRON (FERROUS SULFATE) 325 (65 FE) MG PO TABS
325.0000 mg | ORAL_TABLET | Freq: Every day | ORAL | 1 refills | Status: AC
Start: 2024-05-01 — End: ?

## 2024-05-01 MED ORDER — AMLODIPINE BESYLATE 10 MG PO TABS: 10.0000 mg | ORAL_TABLET | Freq: Every day | ORAL | 1 refills | Status: AC

## 2024-05-01 NOTE — Progress Notes (Signed)
 Progress Note  Physician: Jermiah Soderman A Zahirah Cheslock, MD   HPI: Tiffany Campbell is a 38 y.o. female presenting on 05/01/2024 for Follow-up .  Discussed the use of AI scribe software for clinical note transcription with the patient, who gave verbal consent to proceed.  History of Present Illness   Tiffany Campbell is a 38 year old female with hypertension who presents for blood pressure management.  Hypertension - uncontrolled - Hypertension present for approximately 15 years, onset after the birth of her oldest child - Currently taking hydrochlorothiazide  25 mg and amlodipine  5 - Previously trialed benazepril  without success - Blood pressure readings at work, checked by a nurse, consistently elevated; most recent diastolic reading over 122 mmHg.  Today, 150/102 - No home blood pressure monitor  Sleep disturbance - Poor sleep quality, averaging six hours per night - Wakes two to three times nightly to urinate - Difficulty returning to sleep after awakening - Unisom trialed without improvement  Menorrhagia and dysmenorrhea - Heavy menstrual bleeding with significant back pain and cramping - Back pain and cramping are new symptoms - History of colposcopy - Awaiting follow-up with gynecology  Iron deficiency symptoms - History of low iron levels - Currently taking iron supplementation - Craves ice, which she associates with low iron levels  Chronic lower extremity rash - Non-pruritic, rough rash on legs - Rash present for an extended period - No associated discomfort - Etiology unknown      I personally reviewed and interpreted the patient labs today.      Medical history:  Relevant past medical, surgical, family and social history reviewed and updated as indicated. Interim medical history since our last visit reviewed.  Allergies and medications reviewed and updated.   ROS: Negative unless specifically indicated above in HPI.    Current Outpatient  Medications:    amLODipine  (NORVASC ) 5 MG tablet, TAKE 1 TABLET BY MOUTH DAILY, Disp: 30 tablet, Rfl: 0   hydrochlorothiazide  (HYDRODIURIL ) 25 MG tablet, Take 1 tablet (25 mg total) by mouth daily., Disp: 90 tablet, Rfl: 1   benzonatate (TESSALON) 100 MG capsule, Take 100 mg by mouth 4 (four) times daily as needed., Disp: , Rfl:        Objective:     BP (!) 150/102   Pulse 93   Ht 5' 7 (1.702 m)   Wt 199 lb 3.2 oz (90.4 kg)   LMP 05/01/2024 (Exact Date)   SpO2 99%   BMI 31.20 kg/m   Wt Readings from Last 3 Encounters:  05/01/24 199 lb 3.2 oz (90.4 kg)  04/01/24 199 lb (90.3 kg)  11/04/22 169 lb 6.4 oz (76.8 kg)    Physical Exam  Physical Exam Vitals reviewed.  Constitutional:      Appearance: Normal appearance. Well-developed with normal weight.  Cardiovascular:     Rate and Rhythm: Normal rate and regular rhythm. Normal heart sounds. Normal peripheral pulses Pulmonary:     Normal breath sounds with normal effort Skin:    General: Skin is warm and dry.  Scaling hyperpigmented rash posterior thighs Neurological:     General: No focal deficit present.  Psychiatric:        Mood and Affect: Mood, behavior and cognition normal      Assessment & Plan:  No diagnosis found.  No orders of the defined types were placed in this encounter.    Assessment and Plan    Hypertension  Uncontrolled hypertension with high stroke risk. Current regimen ineffective - Discontinue hydrochlorothiazide . - Initiate chlorthalidone. - Increase amlodipine  to 10 mg, send refill. - Recommend Omron blood pressure cuff for home monitoring. - Follow up in two weeks with blood pressure log via phone or video call. - Set blood pressure goal to <130/80 mmHg.  Hyperlipidemia Hyperlipidemia increases cardiovascular risk alongside hypertension. - Re-evaluate cholesterol levels in six months. - Statin if cholesterol levels remain elevated.  Iron deficiency without anemia Low iron levels with  symptoms of pica, no anemia present. - Prescribe iron supplement daily for three months. - Reassess iron levels after three months.  Heavy menstrual bleeding with dysmenorrhea Recent heavy menstrual bleeding with dysmenorrhea and back pain. - Ensure follow-up with gynecology for evaluation.  Insomnia Unisom ineffective for insomnia, difficulty falling asleep and frequent awakenings. - Prescribe doxylamine for sleep.  Irritant dermatitis of lower extremities Rough, non-itchy rash likely irritant dermatitis. - Prescribe topical treatment for nighttime application.  Vitamin D  deficiency Vitamin D  deficiency noted, important for mood, bone density, and infection prevention. - Recommend Vitamin D3 supplementation at 2000 units daily.   F/u in 2 weeks

## 2024-05-05 ENCOUNTER — Telehealth: Admitting: Physician Assistant

## 2024-05-05 DIAGNOSIS — B9789 Other viral agents as the cause of diseases classified elsewhere: Secondary | ICD-10-CM

## 2024-05-05 DIAGNOSIS — J019 Acute sinusitis, unspecified: Secondary | ICD-10-CM | POA: Diagnosis not present

## 2024-05-06 MED ORDER — FLUTICASONE PROPIONATE 50 MCG/ACT NA SUSP: 2.0000 | Freq: Every day | NASAL | 0 refills | Status: AC

## 2024-05-06 NOTE — Progress Notes (Signed)
 We are sorry that you are not feeling well.  Here is how we plan to help!  Based on what you have shared with me it looks like you have sinusitis.  Sinusitis is inflammation and infection in the sinus cavities of the head.  Based on your presentation I believe you most likely have Acute Viral Sinusitis.This is an infection most likely caused by a virus. There is not specific treatment for viral sinusitis other than to help you with the symptoms until the infection runs its course.  You may use an oral decongestant such as Mucinex D or if you have glaucoma or high blood pressure use plain Mucinex. Saline nasal spray help and can safely be used as often as needed for congestion, I have prescribed: Fluticasone nasal spray two sprays in each nostril once a day  Some authorities believe that zinc sprays or the use of Echinacea may shorten the course of your symptoms.  Sinus infections are not as easily transmitted as other respiratory infection, however we still recommend that you avoid close contact with loved ones, especially the very young and elderly.  Remember to wash your hands thoroughly throughout the day as this is the number one way to prevent the spread of infection!  Home Care: Only take medications as instructed by your medical team. Do not take these medications with alcohol. A steam or ultrasonic humidifier can help congestion.  You can place a towel over your head and breathe in the steam from hot water coming from a faucet. Avoid close contacts especially the very young and the elderly. Cover your mouth when you cough or sneeze. Always remember to wash your hands.  Get Help Right Away If: You develop worsening fever or sinus pain. You develop a severe head ache or visual changes. Your symptoms persist after you have completed your treatment plan.  Make sure you Understand these instructions. Will watch your condition. Will get help right away if you are not doing well or get  worse.  Your e-visit answers were reviewed by a board certified advanced clinical practitioner to complete your personal care plan.  Depending on the condition, your plan could have included both over the counter or prescription medications.  If there is a problem please reply  once you have received a response from your provider.  Your safety is important to us .  If you have drug allergies check your prescription carefully.    You can use MyChart to ask questions about today's visit, request a non-urgent call back, or ask for a work or school excuse for 24 hours related to this e-Visit. If it has been greater than 24 hours you will need to follow up with your provider, or enter a new e-Visit to address those concerns.  You will get an e-mail in the next two days asking about your experience.  I hope that your e-visit has been valuable and will speed your recovery. Thank you for using e-visits.  I have spent 5 minutes in review of e-visit questionnaire, review and updating patient chart, medical decision making and response to patient.   Delon CHRISTELLA Dickinson, PA-C

## 2024-05-16 ENCOUNTER — Telehealth (INDEPENDENT_AMBULATORY_CARE_PROVIDER_SITE_OTHER)

## 2024-05-16 DIAGNOSIS — E611 Iron deficiency: Secondary | ICD-10-CM

## 2024-05-16 DIAGNOSIS — I10 Essential (primary) hypertension: Secondary | ICD-10-CM

## 2024-05-16 DIAGNOSIS — F5101 Primary insomnia: Secondary | ICD-10-CM | POA: Diagnosis not present

## 2024-05-16 DIAGNOSIS — E559 Vitamin D deficiency, unspecified: Secondary | ICD-10-CM

## 2024-05-16 NOTE — Progress Notes (Signed)
            Progress Note  Physician: Raphaella Larkin A Vieno Tarrant, MD   Patient contacted 05/16/24 at  2:40 PM EDT by a video enabled telemedicine application and verified that I am speaking with the correct person.   Patient is aware of limitations of evaluation by telemedicine The patient expressed understanding and agreed to proceed.   HPI: Tiffany Campbell is a 38 y.o. female presenting on 05/16/2024 for No chief complaint on file. . Seen in follow-up for hypertension and insomnia. At last visit, chlorthalidone was initiated and hydrochlorothiazide  discontinued; amlodipine  was increased to 10 mg daily. Home blood pressure now averages 120/70 mmHg. Reports improved sleep with ramelteon. Dermatitis markedly improved with topical triamcinolone. Continues iron and vitamin D  supplementation.   Social history:  Relevant past medical, surgical, family and social history reviewed and updated as indicated. Interim medical history since our last visit reviewed.  Allergies and medications reviewed and updated.  DATA REVIEWED: CHART IN EPIC  ROS: Negative unless specifically indicated above in HPI.    Current Outpatient Medications:    amLODipine  (NORVASC ) 10 MG tablet, Take 1 tablet (10 mg total) by mouth daily., Disp: 90 tablet, Rfl: 1   benzonatate (TESSALON) 100 MG capsule, Take 100 mg by mouth 4 (four) times daily as needed., Disp: , Rfl:    chlorthalidone (HYGROTON) 25 MG tablet, Take 1 tablet (25 mg total) by mouth daily., Disp: 90 tablet, Rfl: 1   fluticasone (FLONASE) 50 MCG/ACT nasal spray, Place 2 sprays into both nostrils daily., Disp: 16 g, Rfl: 0   Iron, Ferrous Sulfate, 325 (65 Fe) MG TABS, Take 325 mg by mouth daily., Disp: 90 tablet, Rfl: 1   ramelteon (ROZEREM) 8 MG tablet, Take 1 tablet (8 mg total) by mouth at bedtime., Disp: 30 tablet, Rfl: 0   triamcinolone cream (KENALOG) 0.1 %, Apply 1 Application topically 2 (two) times daily., Disp: 30 g, Rfl: 0      Objective:   Telephone visit     Assessment & Plan:  Vitamin D  deficiency  Essential hypertension   1. Htn - Continue with current medications.  Monitor BP at home.  F/u in March and we will recheck labs prior to visit.   2.  Insomnia - Continue Ramelteon.  Improved   F/u in March with labs
